# Patient Record
Sex: Male | Born: 1945
Health system: Southern US, Community
[De-identification: ages and names within clinical notes are randomized; demographics above are authoritative.]

## PROBLEM LIST (undated history)

## (undated) DIAGNOSIS — Z794 Long term (current) use of insulin: Secondary | ICD-10-CM

## (undated) DIAGNOSIS — F32A Depression, unspecified: Secondary | ICD-10-CM

## (undated) DIAGNOSIS — N400 Enlarged prostate without lower urinary tract symptoms: Secondary | ICD-10-CM

## (undated) DIAGNOSIS — Z950 Presence of cardiac pacemaker: Secondary | ICD-10-CM

## (undated) DIAGNOSIS — E119 Type 2 diabetes mellitus without complications: Secondary | ICD-10-CM

## (undated) DIAGNOSIS — E7800 Pure hypercholesterolemia, unspecified: Secondary | ICD-10-CM

## (undated) DIAGNOSIS — F329 Major depressive disorder, single episode, unspecified: Secondary | ICD-10-CM

## (undated) DIAGNOSIS — I482 Chronic atrial fibrillation, unspecified: Secondary | ICD-10-CM

## (undated) DIAGNOSIS — K219 Gastro-esophageal reflux disease without esophagitis: Secondary | ICD-10-CM

## (undated) DIAGNOSIS — I5022 Chronic systolic (congestive) heart failure: Secondary | ICD-10-CM

## (undated) DIAGNOSIS — I272 Pulmonary hypertension, unspecified: Secondary | ICD-10-CM

## (undated) DIAGNOSIS — I2510 Atherosclerotic heart disease of native coronary artery without angina pectoris: Secondary | ICD-10-CM

## (undated) DIAGNOSIS — K922 Gastrointestinal hemorrhage, unspecified: Secondary | ICD-10-CM

## (undated) DIAGNOSIS — I639 Cerebral infarction, unspecified: Secondary | ICD-10-CM

## (undated) DIAGNOSIS — I4891 Unspecified atrial fibrillation: Secondary | ICD-10-CM

## (undated) DIAGNOSIS — I340 Nonrheumatic mitral (valve) insufficiency: Secondary | ICD-10-CM

## (undated) DIAGNOSIS — R1310 Dysphagia, unspecified: Secondary | ICD-10-CM

## (undated) DIAGNOSIS — IMO0001 Reserved for inherently not codable concepts without codable children: Secondary | ICD-10-CM

## (undated) DIAGNOSIS — I428 Other cardiomyopathies: Secondary | ICD-10-CM

## (undated) HISTORY — DX: Presence of cardiac pacemaker: Z95.0

## (undated) HISTORY — DX: Nonrheumatic mitral (valve) insufficiency: I34.0

## (undated) HISTORY — DX: Chronic atrial fibrillation, unspecified: I48.20

## (undated) HISTORY — PX: COLONOSCOPY: SHX174

## (undated) HISTORY — DX: Gastrointestinal hemorrhage, unspecified: K92.2

## (undated) HISTORY — DX: Long term (current) use of insulin: Z79.4

## (undated) HISTORY — DX: Pure hypercholesterolemia, unspecified: E78.00

## (undated) HISTORY — DX: Pulmonary hypertension, unspecified: I27.20

## (undated) HISTORY — DX: Type 2 diabetes mellitus without complications: E11.9

## (undated) HISTORY — PX: HERNIA REPAIR: SHX51

## (undated) HISTORY — DX: Chronic systolic (congestive) heart failure: I50.22

## (undated) HISTORY — DX: Atherosclerotic heart disease of native coronary artery without angina pectoris: I25.10

## (undated) HISTORY — DX: Other cardiomyopathies: I42.8

## (undated) HISTORY — DX: Reserved for inherently not codable concepts without codable children: IMO0001

## (undated) HISTORY — PX: PACEMAKER PLACEMENT: SHX43

---

## 2000-06-21 DIAGNOSIS — I2510 Atherosclerotic heart disease of native coronary artery without angina pectoris: Secondary | ICD-10-CM

## 2000-06-21 HISTORY — DX: Atherosclerotic heart disease of native coronary artery without angina pectoris: I25.10

## 2000-09-09 ENCOUNTER — Inpatient Hospital Stay (HOSPITAL_COMMUNITY): Admission: EM | Admit: 2000-09-09 | Discharge: 2000-09-15 | Payer: Self-pay | Admitting: Emergency Medicine

## 2000-09-09 ENCOUNTER — Encounter: Payer: Self-pay | Admitting: *Deleted

## 2000-09-13 ENCOUNTER — Encounter: Payer: Self-pay | Admitting: Cardiology

## 2000-09-15 ENCOUNTER — Encounter: Payer: Self-pay | Admitting: Cardiology

## 2001-09-29 ENCOUNTER — Encounter: Payer: Self-pay | Admitting: Internal Medicine

## 2001-09-29 ENCOUNTER — Ambulatory Visit (HOSPITAL_COMMUNITY): Admission: RE | Admit: 2001-09-29 | Discharge: 2001-09-29 | Payer: Self-pay | Admitting: Internal Medicine

## 2003-08-27 ENCOUNTER — Emergency Department (HOSPITAL_COMMUNITY): Admission: EM | Admit: 2003-08-27 | Discharge: 2003-08-27 | Payer: Self-pay | Admitting: Emergency Medicine

## 2006-04-19 ENCOUNTER — Ambulatory Visit: Payer: Self-pay | Admitting: Cardiology

## 2006-04-19 ENCOUNTER — Encounter: Payer: Self-pay | Admitting: Physician Assistant

## 2006-05-02 ENCOUNTER — Ambulatory Visit: Payer: Self-pay | Admitting: Cardiology

## 2006-05-06 ENCOUNTER — Ambulatory Visit: Payer: Self-pay | Admitting: Cardiology

## 2006-05-06 ENCOUNTER — Encounter: Payer: Self-pay | Admitting: Cardiology

## 2009-09-22 ENCOUNTER — Emergency Department (HOSPITAL_COMMUNITY): Admission: EM | Admit: 2009-09-22 | Discharge: 2009-09-22 | Payer: Self-pay | Admitting: Emergency Medicine

## 2010-01-08 ENCOUNTER — Encounter: Payer: Self-pay | Admitting: Cardiology

## 2010-01-08 ENCOUNTER — Ambulatory Visit: Payer: Self-pay | Admitting: Cardiology

## 2010-01-09 ENCOUNTER — Encounter: Payer: Self-pay | Admitting: Cardiology

## 2010-01-13 ENCOUNTER — Encounter: Payer: Self-pay | Admitting: Physician Assistant

## 2010-01-13 ENCOUNTER — Ambulatory Visit: Payer: Self-pay | Admitting: Cardiology

## 2010-01-29 ENCOUNTER — Encounter: Payer: Self-pay | Admitting: Cardiology

## 2010-01-29 ENCOUNTER — Ambulatory Visit: Payer: Self-pay | Admitting: Physician Assistant

## 2010-01-29 DIAGNOSIS — I498 Other specified cardiac arrhythmias: Secondary | ICD-10-CM

## 2010-01-29 DIAGNOSIS — E119 Type 2 diabetes mellitus without complications: Secondary | ICD-10-CM

## 2010-01-29 DIAGNOSIS — I4891 Unspecified atrial fibrillation: Secondary | ICD-10-CM | POA: Insufficient documentation

## 2010-01-29 DIAGNOSIS — E785 Hyperlipidemia, unspecified: Secondary | ICD-10-CM | POA: Insufficient documentation

## 2010-01-29 DIAGNOSIS — I2510 Atherosclerotic heart disease of native coronary artery without angina pectoris: Secondary | ICD-10-CM | POA: Insufficient documentation

## 2010-01-30 ENCOUNTER — Encounter (INDEPENDENT_AMBULATORY_CARE_PROVIDER_SITE_OTHER): Payer: Self-pay | Admitting: *Deleted

## 2010-02-10 ENCOUNTER — Encounter: Payer: Self-pay | Admitting: Physician Assistant

## 2010-02-25 ENCOUNTER — Encounter: Payer: Self-pay | Admitting: Cardiology

## 2010-02-25 DIAGNOSIS — I10 Essential (primary) hypertension: Secondary | ICD-10-CM | POA: Insufficient documentation

## 2010-02-26 ENCOUNTER — Ambulatory Visit: Payer: Self-pay | Admitting: Cardiology

## 2010-04-07 ENCOUNTER — Ambulatory Visit: Payer: Self-pay | Admitting: Cardiology

## 2010-07-21 NOTE — Procedures (Signed)
Summary: PDS Event Monitor Final Summary  PDS Event Monitor Final Summary   Imported By: Cyril Loosen, RN, BSN 02/18/2010 10:13:50  _____________________________________________________________________  External Attachment:    Type:   Image     Comment:   External Document  Appended Document: PDS Event Monitor Final Summary Will plan to discuss results with pt at office visit on 02/26/10

## 2010-07-21 NOTE — Assessment & Plan Note (Signed)
Summary: EPH -D/C [facility] 7-22   Visit Type:  Follow-up Primary Provider:  Sherryll Burger   History of Present Illness: patient presents for posthospital followup.  Patient recently seen by [place] here at [facility] for atypical chest pain. He presented with history of nonobstructive CAD,  Hypertrophic cardiomyopathy, and paroxysmal atrial fibrillation, status post remote DC cardioversion. He is on chronic Coumadin, followed by the [facility] in [employer]. He was last seen here in the clinic in 2007, by Dr. Myrtis Ser.  Serial cardiac markers were nondiagnostic, with flat troponin in the 0.07 range. A 2-D echo was obtained, indicating EF 60-65% with moderate LVH (no SAM), and mild/moderate MR. Patient remained in atrial fibrillation with slow ventricular response, but no significant pauses, and not on any rate controlling agents.  He was referred for outpatient Lexis scan stress test, which was negative for ischemia EF 54%. He was also placed on a cardiac monitor, and presents today in followup.  Clinically, he denies any further chest pain. He denies tachycardia palpitations or exertional dyspnea. He has intermittent dizziness, but no frank syncope.  Extensive tracings have been obtained from his continuous monitor, and these indicate atrial fibrillation with rates as low as 35 bpm, but with no associated symptoms reported.   Preventive Screening-Counseling & Management  Alcohol-Tobacco     Smoking Status: quit     Year Quit: 1995  Current Medications (verified): 1)  Furosemide 80 Mg Tabs (Furosemide) .... Take 1 Tablet By Mouth Two Times A Day 2)  Alprazolam 0.5 Mg Tabs (Alprazolam) .... Take 1 Tablet By Mouth Once A Day 3)  Glipizide 5 Mg Tabs (Glipizide) .... Take 2 Tablet By Mouth Two Times A Day 4)  Lantus 100 Unit/ml Soln (Insulin Glargine) .... 40u Subcutaneously Daily 5)  Lisinopril 20 Mg Tabs (Lisinopril) .... Take 1/2 Tablet By Mouth Once A Day 6)  Lovastatin 40 Mg Tabs (Lovastatin) ....  Take 1 Tablet By Mouth Once A Day 7)  Metformin Hcl 500 Mg Tabs (Metformin Hcl) .... Take 2 Tablet By Mouth Two Times A Day 8)  Nitrostat 0.4 Mg Subl (Nitroglycerin) .... As Needed 9)  Potassium Chloride Crys Cr 20 Meq Cr-Tabs (Potassium Chloride Crys Cr) .... Take 1 Tablet By Mouth Once A Day 10)  Zoloft 100 Mg Tabs (Sertraline Hcl) .... Take 1 Tablet By Mouth Once A Day 11)  Terazosin Hcl 5 Mg Caps (Terazosin Hcl) .... Take 1 Tablet By Mouth Once A Day 12)  Trazodone Hcl 150 Mg Tabs (Trazodone Hcl) .... Take 1 Tablet By Mouth Once A Day 13)  Coumadin 5 Mg Tabs (Warfarin Sodium) .... Use As Directed  Allergies: 1)  ! Zocor 2)  ! Neurontin 3)  ! * Bee Sting  Comments:  Nurse/Medical Assistant: The patient is currently on medications but does not know the name or dosage at this time. Instructed to contact our office with details. Will update medication list at that time.  Past History:  Past Medical History: Last updated: 01/13/2010  1. History of hypertrophic cardiomyopathy.  This was shown by      catheterization in 2002.   2. Non-obstructive coronary disease by catheterization that was done in      2002.   3. Atrial fibrillation and flutter.   4. Diabetes.  5. Hypertension.  6. Hyperlipidemia.  7. Remote tobacco.  8. History of bradycardia.    9. Status post thyroid surgery.  10.History of lower gastrointestinal bleed with a negative colonoscopy.  11.Bilateral hernia repairs.  Social  History: Smoking Status:  quit  Review of Systems       No fevers, chills, hemoptysis, dysphagia, melena, hematocheezia, hematuria, rash, claudication, orthopnea, pnd, pedal edema. All other systems negative.   Vital Signs:  Patient profile:   65 year old male Height:      68 inches Weight:      224 pounds BMI:     34.18 Pulse rate:   50 / minute BP sitting:   123 / 79  (left arm) Cuff size:   large  Vitals Entered By: Carlye Grippe (January 29, 2010 1:19 PM)  Nutrition  Counseling: Patient's BMI is greater than 25 and therefore counseled on weight management options.   Physical Exam  Additional Exam:  GEN: 65 year old male, obese, no distress HEENT: NCAT,PERRLA,EOMI NECK: palpable pulses, no bruits; no JVD; no TM LUNGS: CTA bilaterally HEART: irregular irregular (S1S2); no significant murmurs; no rubs; no gallops ABD: soft, NT; intact BS EXT: intact distal pulses;trace edema SKIN: warm, dry MUSC: no obvious deformity NEURO: A/O (x3)     EKG  Procedure date:  01/29/2010  Findings:      atrial fibrillation at 45 bpm; LAD; nonspecific ST changes  Impression & Recommendations:  Problem # 1:  ATRIAL FIBRILLATION (ICD-427.31)  patient has documented marked bradycardia, but no significant associated symptoms. Continuous telemetry does not reveal significant pauses. Patient is not on any rate controlling medications. He is on chronic Coumadin, followed at the [facility] in [place]. On review with Dr. Diona Browner, plan is to continue watchful waiting, with no current indication for possible pacemaker implantation. Will check TSH level, to rule out hypothyroidism. We'll schedule return clinic visit with Dr. Myrtis Ser and myself in one month.  Problem # 2:  HOCM (ICD-425.1)  recent echo indicated normal LVF (EF 60-65%), with moderate LVH (no SAM), and mild/moderate MR.  Problem # 3:  CAD (ICD-414.00)  recent negative workup with nondiagnostic troponins, in setting of atypical chest pain. History of nonobstructive CAD by cardiac catheterization in 2002. Followup outpatient non-ischemic Lexiscan Cardiolite.  Problem # 4:  DM (ICD-250.00) Assessment: Comment Only  His updated medication list for this problem includes:    Glipizide 5 Mg Tabs (Glipizide) .Marland Kitchen... Take 2 tablet by mouth two times a day    Lantus 100 Unit/ml Soln (Insulin glargine) .Marland KitchenMarland KitchenMarland KitchenMarland Kitchen 40u subcutaneously daily    Lisinopril 20 Mg Tabs (Lisinopril) .Marland Kitchen... Take 1/2 tablet by mouth once a day     Metformin Hcl 500 Mg Tabs (Metformin hcl) .Marland Kitchen... Take 2 tablet by mouth two times a day  Problem # 5:  DYSLIPIDEMIA (ICD-272.4) Assessment: Comment Only  His updated medication list for this problem includes:    Lovastatin 40 Mg Tabs (Lovastatin) .Marland Kitchen... Take 1 tablet by mouth once a day  Other Orders: EKG w/ Interpretation (93000) T-TSH ([id number])  Patient Instructions: 1)  Follow up with Dr. Myrtis Ser on Thursday, February 26, 2010 at 2:30pm. 2)  You can return [employer]. 3)  Your physician recommends that you go to the [facility] for lab work.

## 2010-07-21 NOTE — Letter (Signed)
Summary: Engineer, materials at [facility]  [address]   [place], [place] [address]   Phone: [phone]  Fax: [phone]        January 30, 2010 MRN: [id number]   Bradley Daugherty [address] [place] [address] [place], [place]  [address]   Dear [name],  Your test ordered by Selena Batten has been reviewed by your physician (or physician assistant) and was found to be normal or stable. Your physician (or physician assistant) felt no changes were needed at this time.  ____ Echocardiogram  ____ Cardiac Stress Test  __X__ Lab Work  ____ Peripheral vascular study of arms, legs or neck  ____ CT scan or X-ray  ____ Lung or Breathing test  ____ Other:   Thank you.   Cyril Loosen, RN, BSN    Duane Boston, M.D., F.A.C.C. Thressa Sheller, M.D., F.A.C.C. Oneal Grout, M.D., F.A.C.C. Cheree Ditto, M.D., F.A.C.C. Daiva Nakayama, M.D., F.A.C.C. Kenney Houseman, M.D., F.A.C.C. Jeanne Ivan, PA-C

## 2010-07-21 NOTE — Miscellaneous (Signed)
  Clinical Lists Changes  Problems: Removed problem of HOCM (ICD-425.1) Added new problem of * ??  HOCM IN THE PAST  ?? Added new problem of * LVH Added new problem of HYPERTENSION (ICD-401.9) Observations: Added new observation of PAST MED HX: ??   hypertrophic cardiomyopathy ??  in the past  ??  not seen on echo 2011 EF 60-65%..... echo... July, 2011 LVH     moderate... echo... July, 2011  ( no SAM) Mitral regurgitation    mild to moderate.... echo... July, 2011 CAD     nonobstructive.... catheter.... 2002  /   nuclear... July, 2011.... no ischemia.... EF 55%  3. Atrial fibrillation and flutter. outpatient monitoring.... July, 2011.... atrial fibrillation.... rates as low as 35 but no symptoms.... plan to watch clinically Bradycardia Coumadin therapy  4. Diabetes.  5. Hypertension.  6. Hyperlipidemia.  7. Remote tobacco.  8. History of bradycardia.    9. Status post thyroid surgery.  10.History of lower gastrointestinal bleed with a negative colonoscopy.  11.Bilateral hernia repairs.  (02/25/2010 13:16) Added new observation of PRIMARY MD: Sherryll Burger (02/25/2010 13:16)       Past History:  Past Medical History: ??   hypertrophic cardiomyopathy ??  in the past  ??  not seen on echo 2011 EF 60-65%..... echo... July, 2011 LVH     moderate... echo... July, 2011  ( no SAM) Mitral regurgitation    mild to moderate.... echo... July, 2011 CAD     nonobstructive.... catheter.... 2002  /   nuclear... July, 2011.... no ischemia.... EF 55%  3. Atrial fibrillation and flutter. outpatient monitoring.... July, 2011.... atrial fibrillation.... rates as low as 35 but no symptoms.... plan to watch clinically Bradycardia Coumadin therapy  4. Diabetes.  5. Hypertension.  6. Hyperlipidemia.  7. Remote tobacco.  8. History of bradycardia.    9. Status post thyroid surgery.  10.History of lower gastrointestinal bleed with a negative colonoscopy.  11.Bilateral hernia repairs.

## 2010-07-21 NOTE — Consult Note (Signed)
Summary: CARDIOLOGY CONSULT/ [facility]  CARDIOLOGY CONSULT/ [facility]   Imported By: Zachary George 01/13/2010 09:02:54  _____________________________________________________________________  External Attachment:    Type:   Image     Comment:   External Document
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
## 2010-07-21 NOTE — Letter (Signed)
Summary: Discharge Summary/ [place]  Discharge Summary/ [place]   Imported By: Dorise Hiss 01/13/2010 09:12:21  _____________________________________________________________________  External Attachment:    Type:   Image     Comment:   External Document

## 2010-07-21 NOTE — Cardiovascular Report (Signed)
Summary: Cardiac Catheterization  Cardiac Catheterization   Imported By: Dorise Hiss 01/13/2010 09:14:07  _____________________________________________________________________  External Attachment:    Type:   Image     Comment:   External Document

## 2010-07-24 NOTE — Letter (Signed)
Summary: [facility] D[facility]C [facility]  [facility]C [facility]   Imported By: Zachary George 01/29/2010 11:45:01  _____________________________________________________________________  External Attachment:    Type:   Image     Comment:   External Document

## 2010-07-24 NOTE — Consult Note (Signed)
Summary: CARDIOLOGY CONSULT/[facility]  CARDIOLOGY CONSULT/[facility]   Imported By: Zachary George 01/13/2010 09:25:25  _____________________________________________________________________  External Attachment:    Type:   Image     Comment:   External Document

## 2010-09-09 LAB — POCT I-STAT, CHEM 8
Creatinine, Ser: 0.7 mg/dL (ref 0.4–1.5)
Hemoglobin: 13.3 g/dL (ref 13.0–17.0)

## 2010-09-09 LAB — GLUCOSE, CAPILLARY: Glucose-Capillary: 139 mg/dL — ABNORMAL HIGH (ref 70–99)

## 2010-11-06 NOTE — Assessment & Plan Note (Signed)
[facility]                            [facility] NOTE   [name], [name]                       MRN:          [id number]  DATE:05/02/2006                            DOB:          Apr 05, 1946    [name] is seen post  hospitalization.  He was recently at [facility].  He  had some chest discomfort and he was in atrial fibrillation and he was  stable.  We recommended that he could go home for outpatient follow up.  He  now returns.  We know that he does have coronary disease.  It is mild.  He  also has hypertrophic cardiomyopathy.  The patient has had atrial  fibrillation in the past.  I cannot document how long he has been in atrial  fibrillation.  Also I cannot document what the thought process was in the  office in [place] in the past, concerning how to approach his atrial  fibrillation, if it were to return.  I am considering cardioversion but with  his hypertrophic cardiomyopathy and other issues, I want to be absolutely  sure that we have fully researched the approach before proceeding.  He is  tolerating his atrial fibrillation at this time.   PAST MEDICAL HISTORY:   ALLERGIES:  He gets joint aches from ZOCOR.   MEDICATIONS:  1. KCL.  2. Warfarin followed at the [place].  3. Terazosin 2 mg at night.  4. Metformin.  5. Glipizide.  6. Lisinopril 10 mg daily.  7. Lovastatin 40.  8. Furosemide 80 b.i.d.   OTHER MEDICAL PROBLEMS:  See the list below.   REVIEW OF SYSTEMS:  As of today he is feeling relatively well.  He is not  having any significant GI or GU symptoms.  His review of systems otherwise  is negative.   PHYSICAL EXAMINATION:  The patient weighs 231 pounds.  Pulse rate is  approximately 60.  Blood pressure is 128/74.  Patient is oriented to person, time and place and his affect is normal.  HEENT:  Reveals no xanthelasma.  He has normal extraocular motion.  There  are no carotid bruits.  There is no jugular venous  distention.  LUNGS:  Are clear.  Respiratory effort is not labored.  CARDIAC:  Reveals an S1 with an S2. The rhythm is irregularly irregular.  There are no significant murmurs heard.  ABDOMEN:  Is obese but soft.  There are no masses or bruits.  The patient has no significant peripheral edema.   EKG reveals atrial fibrillation with a controlled ventricular response.   PROBLEMS:  Include:  1. History of hypertrophic cardiomyopathy.  This was shown by      catheterization in 2002.  We will try to obtain the actual old chart      from [facility], so that I can review all of what has been done in the      past concerning his hypertrophic myopathy and his atrial fibrillation.  2. Non-obstructive coronary disease by catheterization that was done in      2002.  It is also my understanding that he  had a catheterization      somewhere in [facility] in the last year or two.  I do      not have those results.  We know that in 2002 he did have a mid LAD      lesion as high as 70%.  3. Atrial fibrillation and flutter.  The patient is on chronic Coumadin.      His last INR in the hospital was in the 1.5 to 1.8 range.  If we decide      to cardiovert him we need more of a stable INR.  4. Diabetes.  5. Hypertension.  6. Hyperlipidemia.  7. Remote tobacco.  8. History of bradycardia.  His Lopressor had been titrated down over time      because he tends to have some bradycardia.  9. Status post thyroid surgery.  10.History of lower gastrointestinal bleed with a negative colonoscopy.  11.Bilateral hernia repairs.   I will try to get the [facility] records.  We will not change his medicines  today.  We will contact the [facility] to find out if and when  we decide to cardiovert him we can find a way to check his prothrombin time  weekly for 3 or 4 weeks in a row.  Also he needs a 2-D echo for  reassessment.  I will check to see if he has had one lately; if not, we will   arrange it.  Then I will see him in followup.     Bradley Abed, MD, [facility]  Electronically Signed    JDK/MedQ  DD: 05/02/2006  DT: 05/02/2006  Job #: [id number]   cc:   [facility] [place], [place]

## 2010-11-06 NOTE — Cardiovascular Report (Signed)
[place]. [facility]  Patient:    SAQUAN, FURTICK                       MRN: [id number] Proc. Date: 09/13/00 Adm. Date:  [id number] Attending:  Daisey Must CC:         Daisey Must, M.D. [facility]  M. Linna Darner, M.D.  Cardiac Catheterization Lab   Cardiac Catheterization  CLINICAL HISTORY:  Mr. Hettich is 65 years old and has known hypertrophic cardiomyopathy.  At his last echocardiogram, he had no evidence of resting subaortic valve gradient.  He was admitted recently with chest pain and atrial flutter.  His enzymes were negative.  He was scheduled for a cardiac catheterization.  He had been on Coumadin prior to admission, and the Coumadin was held.  DESCRIPTION OF PROCEDURE:  The procedure was performed via the right femoral artery and right femoral vein using a venous sheath and a Swan-Ganz thermodilution catheter.  A front wall arterial puncture was performed, and 6-French coronary catheters were used.  We used a Multipurpose catheter so we could get a better pressure in the left ventricle for a subvalvular gradient. We did not simultaneously measure a femoral artery pressure with the LV pressure.  The patient tolerated the procedure well and left the laboratory in satisfactory condition.  RESULTS: 1. The left main coronary artery:  The left main coronary artery was free of    significant disease. 2. The left anterior descending artery:  The left anterior descending    artery gave rise to 2 diagonal branches and a septal perforator.  There    was 30% proximal stenosis.  The distal LAD was somewhat diffusely diseased,    and there was 70% segmental narrowing. 3. The circumflex artery:  The circumflex artery gave rise to an intermedius    branch, a marginal branch, and 2 posterolateral branches.  There was 30%    proximal stenosis. 4. The right coronary artery:  The right coronary artery gave rise to 2 right    ventricular branches, a posterior  descending branch, and 3 posterolateral    branches.  There was 40% distal stenosis in the right coronary artery.    There was 30% narrowing before the last 2 posterolateral branches.  Left ventriculogram:  The left ventriculogram, performed in the RAO projection, showed marked LVH with marked hypertrophy of the papillary muscles.  There was only minimal mitral regurgitation.  The estimated ejection fraction was 70%.  HEMODYNAMIC DATA:  The right atrial pressure was 13 mean.  The pulmonary artery pressure was 53/18 with a mean of 59.  The pulmonary wedge pressure was 28 mean.  Left ventricular pressure was 161/28.  Aortic pressure was 161/106.  There was little or no gradient across the aortic valve at time of pullback although the patient was in flutter, and we did not do simultaneous LV and FA pressures.  CONCLUSION: 1. Probably nonobstructive coronary artery disease with 30% proximal and 70%    mid stenosis in the left anterior descending artery, 30% narrowing in the    proximal circumflex artery, 40% distal stenosis in the right coronary    artery with 30% stenosis of the posterolateral branch. 2. Hypertrophic cardiomyopathy with marked LVH and marked elevation of the    pulmonary wedge pressure of 28-30.  RECOMMENDATIONS:  The patient has mostly nonobstructive coronary disease and I think this is probably not responsible for his symptoms.  He does have marked elevation of his filling  pressures related to his cardiomyopathy and the atrial flutter.  I discussed this with Dr. Gerri Spore and Dr. Graciela Husbands, and we think the best option is to do a TE-guided cardioversion tomorrow with plans for rescheduling a flutter ablation at some time in the future. DD:  09/13/00 TD:  09/14/00 Job: [id number]

## 2010-11-06 NOTE — Discharge Summary (Signed)
[place]. [facility]  Patient:    TAVI, HOOGENDOORN                       MRN: [id number] Adm. Date:  [id number] Disc. Date: 09/15/00 Attending:  Daisey Must Dictator:   Joellyn Rued, P.A.C. CC:         M. Linna Darner, M.D.  Daisey Must, M.D. [facility]   Discharge Summary  DATE OF BIRTH:  May 28, 1946  SUMMARY OF HISTORY:  Mr. Sitar is a 65 year old white male with a history of atrial fibrillation/flutter who presented to the emergency room complaining of chest cramping.  Initially the discomfort started on the evening prior to his admission around 5:30 p.m.  These had intermittent cramping in the mid sternal area lasting 5-15 seconds.  Patient drove to the emergency room, but left because the discomfort had stopped.  The discomfort returned on the day of admission while driving.  He again experienced this cramping with radiation to the right jaw.  He did go to work and continued to have intermittent episodes. After he became nauseated he went to the emergency room.  He also noticed some shortness of breath in these episodes.  He has also noticed increased dyspnea on exertion for the last month.  He was recently placed on Coumadin approximately two to three weeks ago by Dr. Linna Darner and he states he has not had a PT/INR checked since it was started.  He has a history of hypertrophic cardiomyopathy.  His last echocardiogram on August 19, 2000 showed asymmetrical hypotrophy without resting outflow gradient, mild MR, restrictive filling patterns consistent with increased left diastolic pressure, mild TR.  He has a history of hyperlipidemia and hematochezia but his recent colonoscopy was negative.  History of peptic ulcer disease via EGD six or seven years ago. History of Mallory-Weiss tears after excessive emesis six or seven years ago. Parathyroidectomy.  Benign bilateral inguinal hernia repair.  Stress echocardiogram where he achieved an 87% predicted maximum heart  rate after exercising for four minutes.  He did not have any chest discomfort or arrhythmias and stopped due to fatigue.  He did not have any ischemia. Echocardiogram showed inferolateral ST segment changes and inversion resting. History of remote tobacco use.  LABORATORIES:  Urinalysis is unremarkable.  On admission his initial CK was 183 with an MB of 5.3 and relative index 2.9.  Troponin was 0.08.  Sequence CKs were essentially the same.  Troponins were 0.07.  Sodium 139, potassium 4.0, BUN 16, creatinine 0.9, glucose 131.  Chemistries essentially remained unchanged.  LFTs were within normal limits.  His admission PT was 28.9, INR 4.2.  H&H 13.2 and 39.0.  Normal indices.  Platelets 231,000, WBC 5.3.  Stools were heme negative.  His INR on February 23 was 4.9, on March 24 3.1, on March 25 2.3, on March 26 1.7, on March 27 1.5, on March 28 1.7.  HOSPITAL COURSE:  Mr. Berendt was admitted to 3700.  It was felt that his chest discomfort may be related to ischemia despite enzymes and EKGs being negative. His Coumadin was held in anticipation of cardiac catheterization and heparin will be started once his INR was less than 2.0.  Atrial flutter was also noted on his EKG.  Overnight he did not have any further chest discomfort or shortness of breath.  It was felt that he did not have a myocardial infarction despite slightly elevated troponins.  Right and left cardiac catheterization was performed on  March 26 since his INR was down to 1.7.  According to Dr. Wanita Chamberlain note he had a distal 40% RCA, 30% PLA, 30% proximal LAD, 30% proximal circumflex, 70% mid LAD.  Ejection fraction was 70% without wall motion abnormalities.  He did have marked LVH with an RA pressure of 13, PA 53/18, mean 59, PAW 28, LV 161/28, aorta 161/106.  It was felt that he had primarily nonobstructive coronary artery disease with a markedly elevated PAW. It was felt that he should continue planned medical treatment,  consider flutter ablation, add a beta blocker.  Dr. Graciela Husbands reviewed for consideration of atrial flutter ablation but this could not be scheduled until next week. Thus, TEE guided cardioversion was arranged.  Coumadin was restarted on the evening of the 26th at 5 mg q.d.  TEE cardioversion was performed on March 28 by Dr. Eden Emms utilizing 100, then 200 joules.  He was shocked to normal sinus rhythm.  On March 28 his INR was 1.7 and it was felt that he could be discharged home on subcutaneous Lovenox until his INR was greater than 2.0.  DISCHARGE DIAGNOSES: 1. Chest discomfort of undetermined etiology. 2. Atrial flutter status post DC cardioversion as previously described. 3. Elevated INR on admission. 4. History of hypertrophic cardiomyopathy. 5. Hyperlipidemia.  DISPOSITION:  He is discharged home.  DISCHARGE MEDICATIONS: 1. Lipitor 10 mg q.h.s. 2. Coumadin decreased to 5 mg q.d. except on Sunday and Friday he will take a    half tablet. 3. New prescription for Lasix 40 mg q.d. 4. Aspirin 81 mg q.d. 5. Lopressor 50 mg b.i.d. 6. Lovenox 1 mg q.12h. until his INR is greater than 2.0.  This was discussed with Corrie Dandy of our Coumadin clinic and he will have a PT/INR drawn on Saturday with the results paged to her at home.  She will then call the patient at home and let him know if he needs to adjust his Coumadin and to continue with his Lovenox shots.  He will also have an INR checked on Tuesday at 3:45 p.m. at the Coumadin clinic.  He will see Dr. Ladona Ridgel in three weeks on April 19 at 3:45.  In the meantime he was instructed no heavy lifting, sexual activity, driving for two days and given permission to return to work Monday. He was told not to take his Cardizem.  Maintain low salt, fat, cholesterol diet.  Avoid pseudoephedrine.  If he had any problems with his catheterization site he is asked to call immediately. DD:  09/15/00 TD:  09/15/00 Job: [id number]

## 2011-03-19 DIAGNOSIS — I498 Other specified cardiac arrhythmias: Secondary | ICD-10-CM

## 2012-02-13 DIAGNOSIS — I509 Heart failure, unspecified: Secondary | ICD-10-CM

## 2012-02-14 DIAGNOSIS — I059 Rheumatic mitral valve disease, unspecified: Secondary | ICD-10-CM

## 2012-02-14 DIAGNOSIS — I509 Heart failure, unspecified: Secondary | ICD-10-CM

## 2012-02-15 DIAGNOSIS — I509 Heart failure, unspecified: Secondary | ICD-10-CM

## 2012-02-15 DIAGNOSIS — I5023 Acute on chronic systolic (congestive) heart failure: Secondary | ICD-10-CM

## 2012-02-17 DIAGNOSIS — R0602 Shortness of breath: Secondary | ICD-10-CM

## 2012-02-25 ENCOUNTER — Ambulatory Visit (INDEPENDENT_AMBULATORY_CARE_PROVIDER_SITE_OTHER): Payer: Non-veteran care | Admitting: Internal Medicine

## 2012-02-25 ENCOUNTER — Encounter: Payer: Self-pay | Admitting: Cardiology

## 2012-02-25 ENCOUNTER — Encounter: Payer: Self-pay | Admitting: Internal Medicine

## 2012-02-25 VITALS — BP 130/80 | HR 60 | Ht 68.0 in | Wt 200.8 lb

## 2012-02-25 DIAGNOSIS — Z950 Presence of cardiac pacemaker: Secondary | ICD-10-CM

## 2012-02-25 DIAGNOSIS — I5022 Chronic systolic (congestive) heart failure: Secondary | ICD-10-CM

## 2012-02-25 DIAGNOSIS — I4891 Unspecified atrial fibrillation: Secondary | ICD-10-CM | POA: Diagnosis not present

## 2012-02-25 DIAGNOSIS — I428 Other cardiomyopathies: Secondary | ICD-10-CM | POA: Diagnosis not present

## 2012-02-25 NOTE — Assessment & Plan Note (Signed)
Permanent. On warfarin 

## 2012-02-25 NOTE — Assessment & Plan Note (Signed)
,  dfy The patient's device was interrogated and the information was fully reviewed.  The device was reprogrammed from the DDDR mode to VVI mode

## 2012-02-25 NOTE — Assessment & Plan Note (Signed)
The patient has a newly identified cardiomyopathy, presumably nonischemic based on prior catheterization. It could be related to progressive LV dysfunction, pacemaker-induced cardiomyopathy or intercurrent development of ischemia.  He would like it followed by the [place]. He needs to be placed on carvedilol. He would like this to be started at the [place]. He will probably benefit from Aldactone. He would then need reassessment of left ventricular function to determine whether he would be an appropriate candidate for revision of his pacemaker system to either CRT or ICD

## 2012-02-25 NOTE — Assessment & Plan Note (Signed)
Relatively euvolemic today. We'll continue him on his current medications. He is to be seen at the [place] on Monday. I would at carvedilol as noted above and then consider the addition of Aldactone. His renal function 2 weeks ago was normal with a creatinine of 0.8

## 2012-02-25 NOTE — Progress Notes (Signed)
[facility] Patient Care Team: Kirstie Peri, MD as PCP - General (Internal Medicine)   HPI  Bradley Daugherty is a 66 y.o. male Seen in followup with a history of atrial fibrillation nonobstructive coronary disease diabetes  Recently hospitalized for acute and chronic congestive heart failure. He was found to have an ejection fraction previously at 45-50% now at 30-35%. He was diuresed and discharged.  He has had, intercurrently, implantation of a [employer]tronic Adapta dual-chamber pacemaker. His atrial fibrillation is permanent. He is ventricularly pacing about 100% of the time.  He is feeling better since discharge. There is less edema. He has mild dyspnea on exertion. He has had no chest pain.  Past Medical History  Diagnosis Date  . CAD (coronary artery disease) 2002    Nonobstructive  . Chronic atrial fibrillation     Chronic anticoagulation therapy, followed at the [facility] clinic in [place], [place]  . Hypercholesterolemia   . Pulmonary hypertension   . Moderate mitral regurgitation   . Diabetes mellitus, insulin dependent (IDDM), controlled   . Bradycardia     Resulting in pacemaker placement  . Lower GI bleed     negative colonoscopy  . Atrial dysrhythmia     Past Surgical History  Procedure Date  . Pacemaker placement   . Hernia repair   . Colonoscopy     Current Outpatient Prescriptions  Medication Sig Dispense Refill  . ALPRAZolam (XANAX) 0.5 MG tablet Take 0.5 mg by mouth as needed.      . furosemide (LASIX) 80 MG tablet Take 80 mg by mouth 2 (two) times daily.      Marland Kitchen glipiZIDE (GLUCOTROL) 10 MG tablet Take 10 mg by mouth 2 (two) times daily before a meal.      . insulin glargine (LANTUS) 100 UNIT/ML injection Inject 50 Units into the skin at bedtime.       Marland Kitchen lisinopril (PRINIVIL,ZESTRIL) 20 MG tablet Take 20 mg by mouth daily.      Marland Kitchen lovastatin (MEVACOR) 40 MG tablet Take 40 mg by mouth at bedtime.      . metFORMIN (GLUCOPHAGE) 1000 MG tablet Take 1,000 mg by mouth 2 (two) times  daily with a meal.      . potassium chloride SA (K-DUR,KLOR-CON) 20 MEQ tablet Take 20 mEq by mouth daily.      . sertraline (ZOLOFT) 100 MG tablet Take 100 mg by mouth daily.      . Tamsulosin HCl (FLOMAX) 0.4 MG CAPS Take 0.4 mg by mouth daily after supper.      . traZODone (DESYREL) 150 MG tablet Take 150 mg by mouth at bedtime.      Marland Kitchen warfarin (COUMADIN) 5 MG tablet Take 5 mg by mouth as directed.        Allergies  Allergen Reactions  . Gabapentin     REACTION: myalgia  . Simvastatin     REACTION: myalgia    Review of Systems negative except from HPI and PMH  Physical Exam BP 130/80  Pulse 60  Ht 5\' 8"  (1.727 m)  Wt 200 lb 12.8 oz (91.082 kg)  BMI 30.53 kg/m2 Well developed and well nourished in no acute distress HENT normal E scleral and icterus clear Neck Supple JVP<10 carotids brisk and full Clear to ausculation Device pocket well healed; without hematoma or erythema   Regular rate and rhythm, no murmurs gallops or rub Soft with active bowel sounds No clubbing cyanosis Trace electrocardiogram demonstrates ventricular pacing with underlying atrial fibrillation Edema Alert and  oriented, grossly normal motor and sensory function Skin Warm and Dry    Assessment and  Plan

## 2012-02-25 NOTE — Patient Instructions (Addendum)

## 2012-03-03 ENCOUNTER — Encounter: Payer: PRIVATE HEALTH INSURANCE | Admitting: Cardiology

## 2014-02-26 DIAGNOSIS — I509 Heart failure, unspecified: Secondary | ICD-10-CM | POA: Diagnosis not present

## 2014-02-26 DIAGNOSIS — IMO0001 Reserved for inherently not codable concepts without codable children: Secondary | ICD-10-CM | POA: Diagnosis not present

## 2014-03-11 DIAGNOSIS — R040 Epistaxis: Secondary | ICD-10-CM | POA: Diagnosis not present

## 2014-04-04 DIAGNOSIS — Z23 Encounter for immunization: Secondary | ICD-10-CM | POA: Diagnosis not present

## 2014-06-20 DIAGNOSIS — E1165 Type 2 diabetes mellitus with hyperglycemia: Secondary | ICD-10-CM | POA: Diagnosis not present

## 2014-06-20 DIAGNOSIS — I10 Essential (primary) hypertension: Secondary | ICD-10-CM | POA: Diagnosis not present

## 2014-10-02 DIAGNOSIS — E1165 Type 2 diabetes mellitus with hyperglycemia: Secondary | ICD-10-CM | POA: Diagnosis not present

## 2014-10-02 DIAGNOSIS — Z125 Encounter for screening for malignant neoplasm of prostate: Secondary | ICD-10-CM | POA: Diagnosis not present

## 2014-10-02 DIAGNOSIS — I10 Essential (primary) hypertension: Secondary | ICD-10-CM | POA: Diagnosis not present

## 2014-10-02 DIAGNOSIS — Z7901 Long term (current) use of anticoagulants: Secondary | ICD-10-CM | POA: Diagnosis not present

## 2014-10-02 DIAGNOSIS — Z0000 Encounter for general adult medical examination without abnormal findings: Secondary | ICD-10-CM | POA: Diagnosis not present

## 2014-11-13 DIAGNOSIS — L03114 Cellulitis of left upper limb: Secondary | ICD-10-CM | POA: Diagnosis not present

## 2014-11-21 DIAGNOSIS — L03119 Cellulitis of unspecified part of limb: Secondary | ICD-10-CM | POA: Diagnosis not present

## 2015-01-09 DIAGNOSIS — L309 Dermatitis, unspecified: Secondary | ICD-10-CM | POA: Diagnosis not present

## 2015-01-09 DIAGNOSIS — E1165 Type 2 diabetes mellitus with hyperglycemia: Secondary | ICD-10-CM | POA: Diagnosis not present

## 2015-01-09 DIAGNOSIS — I10 Essential (primary) hypertension: Secondary | ICD-10-CM | POA: Diagnosis not present

## 2015-01-09 DIAGNOSIS — I482 Chronic atrial fibrillation: Secondary | ICD-10-CM | POA: Diagnosis not present

## 2015-04-07 DIAGNOSIS — Z23 Encounter for immunization: Secondary | ICD-10-CM | POA: Diagnosis not present

## 2015-06-03 DIAGNOSIS — E1165 Type 2 diabetes mellitus with hyperglycemia: Secondary | ICD-10-CM | POA: Diagnosis not present

## 2015-06-03 DIAGNOSIS — Z125 Encounter for screening for malignant neoplasm of prostate: Secondary | ICD-10-CM | POA: Diagnosis not present

## 2015-06-03 DIAGNOSIS — I10 Essential (primary) hypertension: Secondary | ICD-10-CM | POA: Diagnosis not present

## 2015-06-03 DIAGNOSIS — N4289 Other specified disorders of prostate: Secondary | ICD-10-CM | POA: Diagnosis not present

## 2015-06-03 DIAGNOSIS — L308 Other specified dermatitis: Secondary | ICD-10-CM | POA: Diagnosis not present

## 2015-06-03 DIAGNOSIS — I482 Chronic atrial fibrillation: Secondary | ICD-10-CM | POA: Diagnosis not present

## 2015-09-08 DIAGNOSIS — R3981 Functional urinary incontinence: Secondary | ICD-10-CM | POA: Diagnosis not present

## 2015-09-08 DIAGNOSIS — R3915 Urgency of urination: Secondary | ICD-10-CM | POA: Diagnosis not present

## 2015-09-08 DIAGNOSIS — R972 Elevated prostate specific antigen [PSA]: Secondary | ICD-10-CM | POA: Diagnosis not present

## 2015-09-09 DIAGNOSIS — R3915 Urgency of urination: Secondary | ICD-10-CM | POA: Diagnosis not present

## 2015-09-09 DIAGNOSIS — R3981 Functional urinary incontinence: Secondary | ICD-10-CM | POA: Diagnosis not present

## 2015-10-21 DIAGNOSIS — R972 Elevated prostate specific antigen [PSA]: Secondary | ICD-10-CM | POA: Diagnosis not present

## 2015-10-30 DIAGNOSIS — R3981 Functional urinary incontinence: Secondary | ICD-10-CM | POA: Diagnosis not present

## 2015-10-30 DIAGNOSIS — R972 Elevated prostate specific antigen [PSA]: Secondary | ICD-10-CM | POA: Diagnosis not present

## 2015-10-30 DIAGNOSIS — R3915 Urgency of urination: Secondary | ICD-10-CM | POA: Diagnosis not present

## 2015-12-05 DIAGNOSIS — I10 Essential (primary) hypertension: Secondary | ICD-10-CM | POA: Diagnosis not present

## 2015-12-05 DIAGNOSIS — I482 Chronic atrial fibrillation: Secondary | ICD-10-CM | POA: Diagnosis not present

## 2015-12-05 DIAGNOSIS — N4289 Other specified disorders of prostate: Secondary | ICD-10-CM | POA: Diagnosis not present

## 2015-12-05 DIAGNOSIS — E1165 Type 2 diabetes mellitus with hyperglycemia: Secondary | ICD-10-CM | POA: Diagnosis not present

## 2016-01-30 DIAGNOSIS — F0280 Dementia in other diseases classified elsewhere without behavioral disturbance: Secondary | ICD-10-CM | POA: Diagnosis not present

## 2016-01-30 DIAGNOSIS — E1165 Type 2 diabetes mellitus with hyperglycemia: Secondary | ICD-10-CM | POA: Diagnosis not present

## 2016-01-30 DIAGNOSIS — I482 Chronic atrial fibrillation: Secondary | ICD-10-CM | POA: Diagnosis not present

## 2016-01-30 DIAGNOSIS — I10 Essential (primary) hypertension: Secondary | ICD-10-CM | POA: Diagnosis not present

## 2016-02-23 DIAGNOSIS — I110 Hypertensive heart disease with heart failure: Secondary | ICD-10-CM | POA: Diagnosis not present

## 2016-02-23 DIAGNOSIS — I509 Heart failure, unspecified: Secondary | ICD-10-CM | POA: Diagnosis not present

## 2016-02-23 DIAGNOSIS — J309 Allergic rhinitis, unspecified: Secondary | ICD-10-CM | POA: Diagnosis not present

## 2016-02-23 DIAGNOSIS — R079 Chest pain, unspecified: Secondary | ICD-10-CM | POA: Diagnosis not present

## 2016-02-23 DIAGNOSIS — I5041 Acute combined systolic (congestive) and diastolic (congestive) heart failure: Secondary | ICD-10-CM | POA: Diagnosis not present

## 2016-02-23 DIAGNOSIS — K8020 Calculus of gallbladder without cholecystitis without obstruction: Secondary | ICD-10-CM | POA: Diagnosis not present

## 2016-02-23 DIAGNOSIS — F0280 Dementia in other diseases classified elsewhere without behavioral disturbance: Secondary | ICD-10-CM | POA: Diagnosis not present

## 2016-02-23 DIAGNOSIS — I209 Angina pectoris, unspecified: Secondary | ICD-10-CM | POA: Diagnosis not present

## 2016-02-23 DIAGNOSIS — F0390 Unspecified dementia without behavioral disturbance: Secondary | ICD-10-CM | POA: Diagnosis not present

## 2016-02-23 DIAGNOSIS — J90 Pleural effusion, not elsewhere classified: Secondary | ICD-10-CM | POA: Diagnosis not present

## 2016-02-23 DIAGNOSIS — R0789 Other chest pain: Secondary | ICD-10-CM | POA: Diagnosis not present

## 2016-02-24 DIAGNOSIS — F0280 Dementia in other diseases classified elsewhere without behavioral disturbance: Secondary | ICD-10-CM | POA: Diagnosis not present

## 2016-02-24 DIAGNOSIS — Z888 Allergy status to other drugs, medicaments and biological substances status: Secondary | ICD-10-CM | POA: Diagnosis not present

## 2016-02-24 DIAGNOSIS — I110 Hypertensive heart disease with heart failure: Secondary | ICD-10-CM | POA: Diagnosis not present

## 2016-02-24 DIAGNOSIS — Z79899 Other long term (current) drug therapy: Secondary | ICD-10-CM | POA: Diagnosis not present

## 2016-02-24 DIAGNOSIS — R079 Chest pain, unspecified: Secondary | ICD-10-CM | POA: Diagnosis present

## 2016-02-24 DIAGNOSIS — N400 Enlarged prostate without lower urinary tract symptoms: Secondary | ICD-10-CM | POA: Diagnosis present

## 2016-02-24 DIAGNOSIS — E785 Hyperlipidemia, unspecified: Secondary | ICD-10-CM | POA: Diagnosis present

## 2016-02-24 DIAGNOSIS — K219 Gastro-esophageal reflux disease without esophagitis: Secondary | ICD-10-CM | POA: Diagnosis present

## 2016-02-24 DIAGNOSIS — Z794 Long term (current) use of insulin: Secondary | ICD-10-CM | POA: Diagnosis not present

## 2016-02-24 DIAGNOSIS — K8010 Calculus of gallbladder with chronic cholecystitis without obstruction: Secondary | ICD-10-CM | POA: Diagnosis not present

## 2016-02-24 DIAGNOSIS — K8020 Calculus of gallbladder without cholecystitis without obstruction: Secondary | ICD-10-CM | POA: Diagnosis present

## 2016-02-24 DIAGNOSIS — F0390 Unspecified dementia without behavioral disturbance: Secondary | ICD-10-CM | POA: Diagnosis present

## 2016-02-24 DIAGNOSIS — Z950 Presence of cardiac pacemaker: Secondary | ICD-10-CM | POA: Diagnosis not present

## 2016-02-24 DIAGNOSIS — J309 Allergic rhinitis, unspecified: Secondary | ICD-10-CM | POA: Diagnosis present

## 2016-02-24 DIAGNOSIS — Z7901 Long term (current) use of anticoagulants: Secondary | ICD-10-CM | POA: Diagnosis not present

## 2016-02-24 DIAGNOSIS — F329 Major depressive disorder, single episode, unspecified: Secondary | ICD-10-CM | POA: Diagnosis present

## 2016-02-24 DIAGNOSIS — E039 Hypothyroidism, unspecified: Secondary | ICD-10-CM | POA: Diagnosis present

## 2016-02-24 DIAGNOSIS — E119 Type 2 diabetes mellitus without complications: Secondary | ICD-10-CM | POA: Diagnosis present

## 2016-02-24 DIAGNOSIS — Z87891 Personal history of nicotine dependence: Secondary | ICD-10-CM | POA: Diagnosis not present

## 2016-02-24 DIAGNOSIS — R0789 Other chest pain: Secondary | ICD-10-CM | POA: Diagnosis not present

## 2016-02-24 DIAGNOSIS — I482 Chronic atrial fibrillation: Secondary | ICD-10-CM | POA: Diagnosis present

## 2016-02-24 DIAGNOSIS — Z8249 Family history of ischemic heart disease and other diseases of the circulatory system: Secondary | ICD-10-CM | POA: Diagnosis not present

## 2016-02-24 DIAGNOSIS — I5041 Acute combined systolic (congestive) and diastolic (congestive) heart failure: Secondary | ICD-10-CM | POA: Diagnosis not present

## 2016-02-24 DIAGNOSIS — Z91030 Bee allergy status: Secondary | ICD-10-CM | POA: Diagnosis not present

## 2016-03-09 DIAGNOSIS — E038 Other specified hypothyroidism: Secondary | ICD-10-CM | POA: Diagnosis not present

## 2016-03-09 DIAGNOSIS — I482 Chronic atrial fibrillation: Secondary | ICD-10-CM | POA: Diagnosis not present

## 2016-03-09 DIAGNOSIS — I10 Essential (primary) hypertension: Secondary | ICD-10-CM | POA: Diagnosis not present

## 2016-03-09 DIAGNOSIS — F338 Other recurrent depressive disorders: Secondary | ICD-10-CM | POA: Diagnosis not present

## 2016-03-09 DIAGNOSIS — N4289 Other specified disorders of prostate: Secondary | ICD-10-CM | POA: Diagnosis not present

## 2016-03-09 DIAGNOSIS — K219 Gastro-esophageal reflux disease without esophagitis: Secondary | ICD-10-CM | POA: Diagnosis not present

## 2016-03-09 DIAGNOSIS — E1165 Type 2 diabetes mellitus with hyperglycemia: Secondary | ICD-10-CM | POA: Diagnosis not present

## 2016-03-09 DIAGNOSIS — F0280 Dementia in other diseases classified elsewhere without behavioral disturbance: Secondary | ICD-10-CM | POA: Diagnosis not present

## 2016-04-27 DIAGNOSIS — R972 Elevated prostate specific antigen [PSA]: Secondary | ICD-10-CM | POA: Diagnosis not present

## 2016-04-28 DIAGNOSIS — F0280 Dementia in other diseases classified elsewhere without behavioral disturbance: Secondary | ICD-10-CM | POA: Diagnosis not present

## 2016-04-28 DIAGNOSIS — R0789 Other chest pain: Secondary | ICD-10-CM | POA: Diagnosis not present

## 2016-04-28 DIAGNOSIS — N400 Enlarged prostate without lower urinary tract symptoms: Secondary | ICD-10-CM | POA: Diagnosis not present

## 2016-04-28 DIAGNOSIS — K219 Gastro-esophageal reflux disease without esophagitis: Secondary | ICD-10-CM | POA: Diagnosis not present

## 2016-04-28 DIAGNOSIS — Z23 Encounter for immunization: Secondary | ICD-10-CM | POA: Diagnosis not present

## 2016-04-28 DIAGNOSIS — E119 Type 2 diabetes mellitus without complications: Secondary | ICD-10-CM | POA: Diagnosis not present

## 2016-04-28 DIAGNOSIS — K8080 Other cholelithiasis without obstruction: Secondary | ICD-10-CM | POA: Diagnosis not present

## 2016-04-28 DIAGNOSIS — R1013 Epigastric pain: Secondary | ICD-10-CM | POA: Diagnosis not present

## 2016-04-28 DIAGNOSIS — J309 Allergic rhinitis, unspecified: Secondary | ICD-10-CM | POA: Diagnosis not present

## 2016-04-28 DIAGNOSIS — E860 Dehydration: Secondary | ICD-10-CM | POA: Diagnosis not present

## 2016-04-28 DIAGNOSIS — E785 Hyperlipidemia, unspecified: Secondary | ICD-10-CM | POA: Diagnosis not present

## 2016-04-28 DIAGNOSIS — R7989 Other specified abnormal findings of blood chemistry: Secondary | ICD-10-CM | POA: Diagnosis not present

## 2016-04-28 DIAGNOSIS — E039 Hypothyroidism, unspecified: Secondary | ICD-10-CM | POA: Diagnosis not present

## 2016-04-28 DIAGNOSIS — J3089 Other allergic rhinitis: Secondary | ICD-10-CM | POA: Diagnosis not present

## 2016-04-28 DIAGNOSIS — I5022 Chronic systolic (congestive) heart failure: Secondary | ICD-10-CM | POA: Diagnosis not present

## 2016-04-28 DIAGNOSIS — R079 Chest pain, unspecified: Secondary | ICD-10-CM | POA: Diagnosis not present

## 2016-04-28 DIAGNOSIS — Z91030 Bee allergy status: Secondary | ICD-10-CM | POA: Diagnosis not present

## 2016-04-28 DIAGNOSIS — Z888 Allergy status to other drugs, medicaments and biological substances status: Secondary | ICD-10-CM | POA: Diagnosis not present

## 2016-04-28 DIAGNOSIS — I10 Essential (primary) hypertension: Secondary | ICD-10-CM | POA: Diagnosis not present

## 2016-04-28 DIAGNOSIS — K8020 Calculus of gallbladder without cholecystitis without obstruction: Secondary | ICD-10-CM | POA: Diagnosis not present

## 2016-04-28 DIAGNOSIS — I482 Chronic atrial fibrillation: Secondary | ICD-10-CM | POA: Diagnosis not present

## 2016-04-28 DIAGNOSIS — F0390 Unspecified dementia without behavioral disturbance: Secondary | ICD-10-CM | POA: Diagnosis not present

## 2016-04-28 DIAGNOSIS — Z79899 Other long term (current) drug therapy: Secondary | ICD-10-CM | POA: Diagnosis not present

## 2016-04-29 DIAGNOSIS — E119 Type 2 diabetes mellitus without complications: Secondary | ICD-10-CM | POA: Diagnosis not present

## 2016-04-29 DIAGNOSIS — J3089 Other allergic rhinitis: Secondary | ICD-10-CM | POA: Diagnosis not present

## 2016-04-29 DIAGNOSIS — R0789 Other chest pain: Secondary | ICD-10-CM | POA: Diagnosis not present

## 2016-04-29 DIAGNOSIS — F0280 Dementia in other diseases classified elsewhere without behavioral disturbance: Secondary | ICD-10-CM | POA: Diagnosis not present

## 2016-04-29 DIAGNOSIS — I5022 Chronic systolic (congestive) heart failure: Secondary | ICD-10-CM | POA: Diagnosis not present

## 2016-05-02 DIAGNOSIS — I509 Heart failure, unspecified: Secondary | ICD-10-CM | POA: Diagnosis not present

## 2016-05-02 DIAGNOSIS — N400 Enlarged prostate without lower urinary tract symptoms: Secondary | ICD-10-CM | POA: Diagnosis not present

## 2016-05-02 DIAGNOSIS — N429 Disorder of prostate, unspecified: Secondary | ICD-10-CM | POA: Diagnosis not present

## 2016-05-02 DIAGNOSIS — K219 Gastro-esophageal reflux disease without esophagitis: Secondary | ICD-10-CM | POA: Diagnosis not present

## 2016-05-02 DIAGNOSIS — Z79899 Other long term (current) drug therapy: Secondary | ICD-10-CM | POA: Diagnosis not present

## 2016-05-02 DIAGNOSIS — N402 Nodular prostate without lower urinary tract symptoms: Secondary | ICD-10-CM | POA: Diagnosis not present

## 2016-05-02 DIAGNOSIS — I4891 Unspecified atrial fibrillation: Secondary | ICD-10-CM | POA: Diagnosis not present

## 2016-05-02 DIAGNOSIS — E119 Type 2 diabetes mellitus without complications: Secondary | ICD-10-CM | POA: Diagnosis not present

## 2016-05-02 DIAGNOSIS — Z794 Long term (current) use of insulin: Secondary | ICD-10-CM | POA: Diagnosis not present

## 2016-05-02 DIAGNOSIS — S199XXA Unspecified injury of neck, initial encounter: Secondary | ICD-10-CM | POA: Diagnosis not present

## 2016-05-02 DIAGNOSIS — T45515A Adverse effect of anticoagulants, initial encounter: Secondary | ICD-10-CM | POA: Diagnosis not present

## 2016-05-02 DIAGNOSIS — N1330 Unspecified hydronephrosis: Secondary | ICD-10-CM | POA: Diagnosis not present

## 2016-05-02 DIAGNOSIS — S0993XA Unspecified injury of face, initial encounter: Secondary | ICD-10-CM | POA: Diagnosis not present

## 2016-05-02 DIAGNOSIS — R1031 Right lower quadrant pain: Secondary | ICD-10-CM | POA: Diagnosis not present

## 2016-05-02 DIAGNOSIS — R197 Diarrhea, unspecified: Secondary | ICD-10-CM | POA: Diagnosis not present

## 2016-05-02 DIAGNOSIS — K8020 Calculus of gallbladder without cholecystitis without obstruction: Secondary | ICD-10-CM | POA: Diagnosis not present

## 2016-05-02 DIAGNOSIS — Z7901 Long term (current) use of anticoagulants: Secondary | ICD-10-CM | POA: Diagnosis not present

## 2016-05-02 DIAGNOSIS — F419 Anxiety disorder, unspecified: Secondary | ICD-10-CM | POA: Diagnosis not present

## 2016-05-02 DIAGNOSIS — I110 Hypertensive heart disease with heart failure: Secondary | ICD-10-CM | POA: Diagnosis not present

## 2016-05-02 DIAGNOSIS — Z8249 Family history of ischemic heart disease and other diseases of the circulatory system: Secondary | ICD-10-CM | POA: Diagnosis not present

## 2016-05-02 DIAGNOSIS — T887XXA Unspecified adverse effect of drug or medicament, initial encounter: Secondary | ICD-10-CM | POA: Diagnosis not present

## 2016-05-02 DIAGNOSIS — E039 Hypothyroidism, unspecified: Secondary | ICD-10-CM | POA: Diagnosis not present

## 2016-05-02 DIAGNOSIS — Z87891 Personal history of nicotine dependence: Secondary | ICD-10-CM | POA: Diagnosis not present

## 2016-05-02 DIAGNOSIS — E7800 Pure hypercholesterolemia, unspecified: Secondary | ICD-10-CM | POA: Diagnosis not present

## 2016-05-02 DIAGNOSIS — R188 Other ascites: Secondary | ICD-10-CM | POA: Diagnosis not present

## 2016-05-02 DIAGNOSIS — Z833 Family history of diabetes mellitus: Secondary | ICD-10-CM | POA: Diagnosis not present

## 2016-05-02 DIAGNOSIS — R1032 Left lower quadrant pain: Secondary | ICD-10-CM | POA: Diagnosis not present

## 2016-05-02 DIAGNOSIS — S0990XA Unspecified injury of head, initial encounter: Secondary | ICD-10-CM | POA: Diagnosis not present

## 2016-05-02 DIAGNOSIS — F329 Major depressive disorder, single episode, unspecified: Secondary | ICD-10-CM | POA: Diagnosis not present

## 2016-05-02 DIAGNOSIS — N3289 Other specified disorders of bladder: Secondary | ICD-10-CM | POA: Diagnosis not present

## 2016-05-02 DIAGNOSIS — E041 Nontoxic single thyroid nodule: Secondary | ICD-10-CM | POA: Diagnosis not present

## 2016-05-03 DIAGNOSIS — Z743 Need for continuous supervision: Secondary | ICD-10-CM | POA: Diagnosis not present

## 2016-05-03 DIAGNOSIS — R279 Unspecified lack of coordination: Secondary | ICD-10-CM | POA: Diagnosis not present

## 2016-05-06 DIAGNOSIS — I509 Heart failure, unspecified: Secondary | ICD-10-CM | POA: Diagnosis not present

## 2016-05-06 DIAGNOSIS — Z794 Long term (current) use of insulin: Secondary | ICD-10-CM | POA: Diagnosis not present

## 2016-05-06 DIAGNOSIS — Z7901 Long term (current) use of anticoagulants: Secondary | ICD-10-CM | POA: Diagnosis not present

## 2016-05-06 DIAGNOSIS — R5383 Other fatigue: Secondary | ICD-10-CM | POA: Diagnosis not present

## 2016-05-06 DIAGNOSIS — I4891 Unspecified atrial fibrillation: Secondary | ICD-10-CM | POA: Diagnosis not present

## 2016-05-06 DIAGNOSIS — Z87891 Personal history of nicotine dependence: Secondary | ICD-10-CM | POA: Diagnosis not present

## 2016-05-06 DIAGNOSIS — K219 Gastro-esophageal reflux disease without esophagitis: Secondary | ICD-10-CM | POA: Diagnosis not present

## 2016-05-06 DIAGNOSIS — R279 Unspecified lack of coordination: Secondary | ICD-10-CM | POA: Diagnosis not present

## 2016-05-06 DIAGNOSIS — E876 Hypokalemia: Secondary | ICD-10-CM | POA: Diagnosis not present

## 2016-05-06 DIAGNOSIS — Z7401 Bed confinement status: Secondary | ICD-10-CM | POA: Diagnosis not present

## 2016-05-06 DIAGNOSIS — E11649 Type 2 diabetes mellitus with hypoglycemia without coma: Secondary | ICD-10-CM | POA: Diagnosis not present

## 2016-05-06 DIAGNOSIS — Z95810 Presence of automatic (implantable) cardiac defibrillator: Secondary | ICD-10-CM | POA: Diagnosis not present

## 2016-05-06 DIAGNOSIS — I10 Essential (primary) hypertension: Secondary | ICD-10-CM | POA: Diagnosis not present

## 2016-05-06 DIAGNOSIS — R0682 Tachypnea, not elsewhere classified: Secondary | ICD-10-CM | POA: Diagnosis not present

## 2016-05-06 DIAGNOSIS — Z833 Family history of diabetes mellitus: Secondary | ICD-10-CM | POA: Diagnosis not present

## 2016-05-06 DIAGNOSIS — N400 Enlarged prostate without lower urinary tract symptoms: Secondary | ICD-10-CM | POA: Diagnosis not present

## 2016-05-06 DIAGNOSIS — I110 Hypertensive heart disease with heart failure: Secondary | ICD-10-CM | POA: Diagnosis not present

## 2016-05-06 DIAGNOSIS — R531 Weakness: Secondary | ICD-10-CM | POA: Diagnosis not present

## 2016-05-06 DIAGNOSIS — E875 Hyperkalemia: Secondary | ICD-10-CM | POA: Diagnosis not present

## 2016-05-06 DIAGNOSIS — F329 Major depressive disorder, single episode, unspecified: Secondary | ICD-10-CM | POA: Diagnosis not present

## 2016-05-06 DIAGNOSIS — R140 Abdominal distension (gaseous): Secondary | ICD-10-CM | POA: Diagnosis not present

## 2016-05-07 DIAGNOSIS — K219 Gastro-esophageal reflux disease without esophagitis: Secondary | ICD-10-CM | POA: Diagnosis not present

## 2016-05-07 DIAGNOSIS — F0280 Dementia in other diseases classified elsewhere without behavioral disturbance: Secondary | ICD-10-CM | POA: Diagnosis not present

## 2016-05-07 DIAGNOSIS — E1165 Type 2 diabetes mellitus with hyperglycemia: Secondary | ICD-10-CM | POA: Diagnosis not present

## 2016-05-07 DIAGNOSIS — I482 Chronic atrial fibrillation: Secondary | ICD-10-CM | POA: Diagnosis not present

## 2016-05-07 DIAGNOSIS — Z0000 Encounter for general adult medical examination without abnormal findings: Secondary | ICD-10-CM | POA: Diagnosis not present

## 2016-05-07 DIAGNOSIS — N4289 Other specified disorders of prostate: Secondary | ICD-10-CM | POA: Diagnosis not present

## 2016-05-07 DIAGNOSIS — E038 Other specified hypothyroidism: Secondary | ICD-10-CM | POA: Diagnosis not present

## 2016-05-07 DIAGNOSIS — Z1389 Encounter for screening for other disorder: Secondary | ICD-10-CM | POA: Diagnosis not present

## 2016-05-07 DIAGNOSIS — F338 Other recurrent depressive disorders: Secondary | ICD-10-CM | POA: Diagnosis not present

## 2016-05-07 DIAGNOSIS — Z125 Encounter for screening for malignant neoplasm of prostate: Secondary | ICD-10-CM | POA: Diagnosis not present

## 2016-05-07 DIAGNOSIS — I10 Essential (primary) hypertension: Secondary | ICD-10-CM | POA: Diagnosis not present

## 2016-05-07 DIAGNOSIS — I208 Other forms of angina pectoris: Secondary | ICD-10-CM | POA: Diagnosis not present

## 2016-05-18 DIAGNOSIS — Z9289 Personal history of other medical treatment: Secondary | ICD-10-CM | POA: Diagnosis not present

## 2016-05-18 DIAGNOSIS — R972 Elevated prostate specific antigen [PSA]: Secondary | ICD-10-CM | POA: Diagnosis not present

## 2016-05-18 DIAGNOSIS — R339 Retention of urine, unspecified: Secondary | ICD-10-CM | POA: Diagnosis not present

## 2016-05-18 DIAGNOSIS — R3981 Functional urinary incontinence: Secondary | ICD-10-CM | POA: Diagnosis not present

## 2016-05-21 DIAGNOSIS — E119 Type 2 diabetes mellitus without complications: Secondary | ICD-10-CM | POA: Diagnosis not present

## 2016-05-21 DIAGNOSIS — Z87891 Personal history of nicotine dependence: Secondary | ICD-10-CM | POA: Diagnosis not present

## 2016-05-21 DIAGNOSIS — Z95810 Presence of automatic (implantable) cardiac defibrillator: Secondary | ICD-10-CM | POA: Diagnosis not present

## 2016-05-21 DIAGNOSIS — Z8249 Family history of ischemic heart disease and other diseases of the circulatory system: Secondary | ICD-10-CM | POA: Diagnosis not present

## 2016-05-21 DIAGNOSIS — Z794 Long term (current) use of insulin: Secondary | ICD-10-CM | POA: Diagnosis not present

## 2016-05-21 DIAGNOSIS — S069X1A Unspecified intracranial injury with loss of consciousness of 30 minutes or less, initial encounter: Secondary | ICD-10-CM | POA: Diagnosis not present

## 2016-05-21 DIAGNOSIS — R51 Headache: Secondary | ICD-10-CM | POA: Diagnosis not present

## 2016-05-21 DIAGNOSIS — F329 Major depressive disorder, single episode, unspecified: Secondary | ICD-10-CM | POA: Diagnosis not present

## 2016-05-21 DIAGNOSIS — S098XXA Other specified injuries of head, initial encounter: Secondary | ICD-10-CM | POA: Diagnosis not present

## 2016-05-21 DIAGNOSIS — N400 Enlarged prostate without lower urinary tract symptoms: Secondary | ICD-10-CM | POA: Diagnosis not present

## 2016-05-21 DIAGNOSIS — S0990XA Unspecified injury of head, initial encounter: Secondary | ICD-10-CM | POA: Diagnosis not present

## 2016-05-21 DIAGNOSIS — Z7901 Long term (current) use of anticoagulants: Secondary | ICD-10-CM | POA: Diagnosis not present

## 2016-05-21 DIAGNOSIS — R220 Localized swelling, mass and lump, head: Secondary | ICD-10-CM | POA: Diagnosis not present

## 2016-05-21 DIAGNOSIS — K219 Gastro-esophageal reflux disease without esophagitis: Secondary | ICD-10-CM | POA: Diagnosis not present

## 2016-05-21 DIAGNOSIS — I4891 Unspecified atrial fibrillation: Secondary | ICD-10-CM | POA: Diagnosis not present

## 2016-05-21 DIAGNOSIS — I509 Heart failure, unspecified: Secondary | ICD-10-CM | POA: Diagnosis not present

## 2016-05-21 DIAGNOSIS — Z833 Family history of diabetes mellitus: Secondary | ICD-10-CM | POA: Diagnosis not present

## 2016-05-21 DIAGNOSIS — D689 Coagulation defect, unspecified: Secondary | ICD-10-CM | POA: Diagnosis not present

## 2016-05-21 DIAGNOSIS — W06XXXA Fall from bed, initial encounter: Secondary | ICD-10-CM | POA: Diagnosis not present

## 2016-05-21 DIAGNOSIS — I110 Hypertensive heart disease with heart failure: Secondary | ICD-10-CM | POA: Diagnosis not present

## 2016-05-26 DIAGNOSIS — N4289 Other specified disorders of prostate: Secondary | ICD-10-CM | POA: Diagnosis not present

## 2016-05-26 DIAGNOSIS — I10 Essential (primary) hypertension: Secondary | ICD-10-CM | POA: Diagnosis not present

## 2016-05-26 DIAGNOSIS — E784 Other hyperlipidemia: Secondary | ICD-10-CM | POA: Diagnosis not present

## 2016-05-26 DIAGNOSIS — I482 Chronic atrial fibrillation: Secondary | ICD-10-CM | POA: Diagnosis not present

## 2016-05-26 DIAGNOSIS — E1165 Type 2 diabetes mellitus with hyperglycemia: Secondary | ICD-10-CM | POA: Diagnosis not present

## 2016-06-18 DIAGNOSIS — Z833 Family history of diabetes mellitus: Secondary | ICD-10-CM | POA: Diagnosis not present

## 2016-06-18 DIAGNOSIS — K8020 Calculus of gallbladder without cholecystitis without obstruction: Secondary | ICD-10-CM | POA: Diagnosis not present

## 2016-06-18 DIAGNOSIS — E039 Hypothyroidism, unspecified: Secondary | ICD-10-CM | POA: Diagnosis not present

## 2016-06-18 DIAGNOSIS — E119 Type 2 diabetes mellitus without complications: Secondary | ICD-10-CM | POA: Diagnosis not present

## 2016-06-18 DIAGNOSIS — I10 Essential (primary) hypertension: Secondary | ICD-10-CM | POA: Diagnosis not present

## 2016-06-18 DIAGNOSIS — F0390 Unspecified dementia without behavioral disturbance: Secondary | ICD-10-CM | POA: Diagnosis not present

## 2016-06-18 DIAGNOSIS — F0280 Dementia in other diseases classified elsewhere without behavioral disturbance: Secondary | ICD-10-CM | POA: Diagnosis not present

## 2016-06-18 DIAGNOSIS — Z888 Allergy status to other drugs, medicaments and biological substances status: Secondary | ICD-10-CM | POA: Diagnosis not present

## 2016-06-18 DIAGNOSIS — R319 Hematuria, unspecified: Secondary | ICD-10-CM | POA: Diagnosis not present

## 2016-06-18 DIAGNOSIS — J309 Allergic rhinitis, unspecified: Secondary | ICD-10-CM | POA: Diagnosis not present

## 2016-06-18 DIAGNOSIS — Z87891 Personal history of nicotine dependence: Secondary | ICD-10-CM | POA: Diagnosis not present

## 2016-06-18 DIAGNOSIS — D6832 Hemorrhagic disorder due to extrinsic circulating anticoagulants: Secondary | ICD-10-CM | POA: Diagnosis not present

## 2016-06-18 DIAGNOSIS — R791 Abnormal coagulation profile: Secondary | ICD-10-CM | POA: Diagnosis not present

## 2016-06-18 DIAGNOSIS — R310 Gross hematuria: Secondary | ICD-10-CM | POA: Diagnosis not present

## 2016-06-18 DIAGNOSIS — I482 Chronic atrial fibrillation: Secondary | ICD-10-CM | POA: Diagnosis not present

## 2016-06-18 DIAGNOSIS — E785 Hyperlipidemia, unspecified: Secondary | ICD-10-CM | POA: Diagnosis not present

## 2016-06-18 DIAGNOSIS — J3089 Other allergic rhinitis: Secondary | ICD-10-CM | POA: Diagnosis not present

## 2016-06-18 DIAGNOSIS — N400 Enlarged prostate without lower urinary tract symptoms: Secondary | ICD-10-CM | POA: Diagnosis not present

## 2016-06-18 DIAGNOSIS — N390 Urinary tract infection, site not specified: Secondary | ICD-10-CM | POA: Diagnosis not present

## 2016-06-18 DIAGNOSIS — K219 Gastro-esophageal reflux disease without esophagitis: Secondary | ICD-10-CM | POA: Diagnosis not present

## 2016-06-18 DIAGNOSIS — E860 Dehydration: Secondary | ICD-10-CM | POA: Diagnosis not present

## 2016-06-18 DIAGNOSIS — Z8249 Family history of ischemic heart disease and other diseases of the circulatory system: Secondary | ICD-10-CM | POA: Diagnosis not present

## 2016-06-18 DIAGNOSIS — I5022 Chronic systolic (congestive) heart failure: Secondary | ICD-10-CM | POA: Diagnosis not present

## 2016-06-18 DIAGNOSIS — R1031 Right lower quadrant pain: Secondary | ICD-10-CM | POA: Diagnosis not present

## 2016-06-18 DIAGNOSIS — F329 Major depressive disorder, single episode, unspecified: Secondary | ICD-10-CM | POA: Diagnosis not present

## 2016-06-19 DIAGNOSIS — I5022 Chronic systolic (congestive) heart failure: Secondary | ICD-10-CM | POA: Diagnosis not present

## 2016-06-19 DIAGNOSIS — F0280 Dementia in other diseases classified elsewhere without behavioral disturbance: Secondary | ICD-10-CM | POA: Diagnosis not present

## 2016-06-19 DIAGNOSIS — J3089 Other allergic rhinitis: Secondary | ICD-10-CM | POA: Diagnosis not present

## 2016-06-19 DIAGNOSIS — R310 Gross hematuria: Secondary | ICD-10-CM | POA: Diagnosis not present

## 2016-06-19 DIAGNOSIS — R791 Abnormal coagulation profile: Secondary | ICD-10-CM | POA: Diagnosis not present

## 2016-06-20 DIAGNOSIS — R791 Abnormal coagulation profile: Secondary | ICD-10-CM | POA: Diagnosis not present

## 2016-06-20 DIAGNOSIS — R279 Unspecified lack of coordination: Secondary | ICD-10-CM | POA: Diagnosis not present

## 2016-06-20 DIAGNOSIS — J3089 Other allergic rhinitis: Secondary | ICD-10-CM | POA: Diagnosis not present

## 2016-06-20 DIAGNOSIS — R310 Gross hematuria: Secondary | ICD-10-CM | POA: Diagnosis not present

## 2016-06-20 DIAGNOSIS — Z7401 Bed confinement status: Secondary | ICD-10-CM | POA: Diagnosis not present

## 2016-06-20 DIAGNOSIS — F0280 Dementia in other diseases classified elsewhere without behavioral disturbance: Secondary | ICD-10-CM | POA: Diagnosis not present

## 2016-06-20 DIAGNOSIS — I5022 Chronic systolic (congestive) heart failure: Secondary | ICD-10-CM | POA: Diagnosis not present

## 2016-06-21 DIAGNOSIS — I4891 Unspecified atrial fibrillation: Secondary | ICD-10-CM | POA: Diagnosis not present

## 2016-06-21 DIAGNOSIS — R1030 Lower abdominal pain, unspecified: Secondary | ICD-10-CM | POA: Diagnosis not present

## 2016-06-21 DIAGNOSIS — R339 Retention of urine, unspecified: Secondary | ICD-10-CM | POA: Diagnosis not present

## 2016-06-21 DIAGNOSIS — I110 Hypertensive heart disease with heart failure: Secondary | ICD-10-CM | POA: Diagnosis not present

## 2016-06-21 DIAGNOSIS — R1031 Right lower quadrant pain: Secondary | ICD-10-CM | POA: Diagnosis not present

## 2016-06-21 DIAGNOSIS — M5489 Other dorsalgia: Secondary | ICD-10-CM | POA: Diagnosis not present

## 2016-06-21 DIAGNOSIS — Z87891 Personal history of nicotine dependence: Secondary | ICD-10-CM | POA: Diagnosis not present

## 2016-06-21 DIAGNOSIS — F0390 Unspecified dementia without behavioral disturbance: Secondary | ICD-10-CM | POA: Diagnosis not present

## 2016-06-21 DIAGNOSIS — F05 Delirium due to known physiological condition: Secondary | ICD-10-CM | POA: Diagnosis not present

## 2016-06-21 DIAGNOSIS — Z743 Need for continuous supervision: Secondary | ICD-10-CM | POA: Diagnosis not present

## 2016-06-21 DIAGNOSIS — Z9119 Patient's noncompliance with other medical treatment and regimen: Secondary | ICD-10-CM | POA: Diagnosis not present

## 2016-06-21 DIAGNOSIS — E119 Type 2 diabetes mellitus without complications: Secondary | ICD-10-CM | POA: Diagnosis not present

## 2016-06-21 DIAGNOSIS — Z833 Family history of diabetes mellitus: Secondary | ICD-10-CM | POA: Diagnosis not present

## 2016-06-21 DIAGNOSIS — Z95810 Presence of automatic (implantable) cardiac defibrillator: Secondary | ICD-10-CM | POA: Diagnosis not present

## 2016-06-21 DIAGNOSIS — F329 Major depressive disorder, single episode, unspecified: Secondary | ICD-10-CM | POA: Diagnosis not present

## 2016-06-21 DIAGNOSIS — I509 Heart failure, unspecified: Secondary | ICD-10-CM | POA: Diagnosis not present

## 2016-07-01 DIAGNOSIS — K219 Gastro-esophageal reflux disease without esophagitis: Secondary | ICD-10-CM | POA: Diagnosis not present

## 2016-07-01 DIAGNOSIS — D638 Anemia in other chronic diseases classified elsewhere: Secondary | ICD-10-CM | POA: Diagnosis not present

## 2016-07-01 DIAGNOSIS — J309 Allergic rhinitis, unspecified: Secondary | ICD-10-CM | POA: Diagnosis not present

## 2016-07-01 DIAGNOSIS — R1031 Right lower quadrant pain: Secondary | ICD-10-CM | POA: Diagnosis not present

## 2016-07-01 DIAGNOSIS — Z794 Long term (current) use of insulin: Secondary | ICD-10-CM | POA: Diagnosis not present

## 2016-07-01 DIAGNOSIS — E039 Hypothyroidism, unspecified: Secondary | ICD-10-CM | POA: Diagnosis not present

## 2016-07-01 DIAGNOSIS — E785 Hyperlipidemia, unspecified: Secondary | ICD-10-CM | POA: Diagnosis not present

## 2016-07-01 DIAGNOSIS — F339 Major depressive disorder, recurrent, unspecified: Secondary | ICD-10-CM | POA: Diagnosis not present

## 2016-07-01 DIAGNOSIS — Z91030 Bee allergy status: Secondary | ICD-10-CM | POA: Diagnosis not present

## 2016-07-01 DIAGNOSIS — F0390 Unspecified dementia without behavioral disturbance: Secondary | ICD-10-CM | POA: Diagnosis not present

## 2016-07-01 DIAGNOSIS — Z888 Allergy status to other drugs, medicaments and biological substances status: Secondary | ICD-10-CM | POA: Diagnosis not present

## 2016-07-01 DIAGNOSIS — J9811 Atelectasis: Secondary | ICD-10-CM | POA: Diagnosis not present

## 2016-07-01 DIAGNOSIS — D6489 Other specified anemias: Secondary | ICD-10-CM | POA: Diagnosis not present

## 2016-07-01 DIAGNOSIS — I5022 Chronic systolic (congestive) heart failure: Secondary | ICD-10-CM | POA: Diagnosis not present

## 2016-07-01 DIAGNOSIS — I110 Hypertensive heart disease with heart failure: Secondary | ICD-10-CM | POA: Diagnosis not present

## 2016-07-01 DIAGNOSIS — K8020 Calculus of gallbladder without cholecystitis without obstruction: Secondary | ICD-10-CM | POA: Diagnosis not present

## 2016-07-01 DIAGNOSIS — K6289 Other specified diseases of anus and rectum: Secondary | ICD-10-CM | POA: Diagnosis not present

## 2016-07-01 DIAGNOSIS — E119 Type 2 diabetes mellitus without complications: Secondary | ICD-10-CM | POA: Diagnosis not present

## 2016-07-01 DIAGNOSIS — N400 Enlarged prostate without lower urinary tract symptoms: Secondary | ICD-10-CM | POA: Diagnosis not present

## 2016-07-01 DIAGNOSIS — B965 Pseudomonas (aeruginosa) (mallei) (pseudomallei) as the cause of diseases classified elsewhere: Secondary | ICD-10-CM | POA: Diagnosis not present

## 2016-07-01 DIAGNOSIS — R1011 Right upper quadrant pain: Secondary | ICD-10-CM | POA: Diagnosis not present

## 2016-07-01 DIAGNOSIS — N390 Urinary tract infection, site not specified: Secondary | ICD-10-CM | POA: Diagnosis not present

## 2016-07-01 DIAGNOSIS — I482 Chronic atrial fibrillation: Secondary | ICD-10-CM | POA: Diagnosis not present

## 2016-07-01 DIAGNOSIS — Z79899 Other long term (current) drug therapy: Secondary | ICD-10-CM | POA: Diagnosis not present

## 2016-07-02 DIAGNOSIS — I110 Hypertensive heart disease with heart failure: Secondary | ICD-10-CM | POA: Diagnosis not present

## 2016-07-02 DIAGNOSIS — N390 Urinary tract infection, site not specified: Secondary | ICD-10-CM | POA: Diagnosis not present

## 2016-07-02 DIAGNOSIS — D638 Anemia in other chronic diseases classified elsewhere: Secondary | ICD-10-CM | POA: Diagnosis not present

## 2016-07-02 DIAGNOSIS — K6289 Other specified diseases of anus and rectum: Secondary | ICD-10-CM | POA: Diagnosis not present

## 2016-07-02 DIAGNOSIS — B965 Pseudomonas (aeruginosa) (mallei) (pseudomallei) as the cause of diseases classified elsewhere: Secondary | ICD-10-CM | POA: Diagnosis not present

## 2016-07-03 DIAGNOSIS — B965 Pseudomonas (aeruginosa) (mallei) (pseudomallei) as the cause of diseases classified elsewhere: Secondary | ICD-10-CM | POA: Diagnosis not present

## 2016-07-03 DIAGNOSIS — K6289 Other specified diseases of anus and rectum: Secondary | ICD-10-CM | POA: Diagnosis not present

## 2016-07-03 DIAGNOSIS — N390 Urinary tract infection, site not specified: Secondary | ICD-10-CM | POA: Diagnosis not present

## 2016-07-03 DIAGNOSIS — I110 Hypertensive heart disease with heart failure: Secondary | ICD-10-CM | POA: Diagnosis not present

## 2016-07-03 DIAGNOSIS — D638 Anemia in other chronic diseases classified elsewhere: Secondary | ICD-10-CM | POA: Diagnosis not present

## 2016-07-04 DIAGNOSIS — E119 Type 2 diabetes mellitus without complications: Secondary | ICD-10-CM | POA: Diagnosis not present

## 2016-07-04 DIAGNOSIS — Z794 Long term (current) use of insulin: Secondary | ICD-10-CM | POA: Diagnosis not present

## 2016-07-04 DIAGNOSIS — R0789 Other chest pain: Secondary | ICD-10-CM | POA: Diagnosis not present

## 2016-07-04 DIAGNOSIS — N390 Urinary tract infection, site not specified: Secondary | ICD-10-CM | POA: Diagnosis not present

## 2016-07-04 DIAGNOSIS — K8020 Calculus of gallbladder without cholecystitis without obstruction: Secondary | ICD-10-CM | POA: Diagnosis not present

## 2016-07-04 DIAGNOSIS — I110 Hypertensive heart disease with heart failure: Secondary | ICD-10-CM | POA: Diagnosis not present

## 2016-07-04 DIAGNOSIS — R079 Chest pain, unspecified: Secondary | ICD-10-CM | POA: Diagnosis not present

## 2016-07-04 DIAGNOSIS — Z95810 Presence of automatic (implantable) cardiac defibrillator: Secondary | ICD-10-CM | POA: Diagnosis not present

## 2016-07-04 DIAGNOSIS — J90 Pleural effusion, not elsewhere classified: Secondary | ICD-10-CM | POA: Diagnosis not present

## 2016-07-04 DIAGNOSIS — K219 Gastro-esophageal reflux disease without esophagitis: Secondary | ICD-10-CM | POA: Diagnosis not present

## 2016-07-04 DIAGNOSIS — J9811 Atelectasis: Secondary | ICD-10-CM | POA: Diagnosis not present

## 2016-07-04 DIAGNOSIS — R0989 Other specified symptoms and signs involving the circulatory and respiratory systems: Secondary | ICD-10-CM | POA: Diagnosis not present

## 2016-07-04 DIAGNOSIS — R1011 Right upper quadrant pain: Secondary | ICD-10-CM | POA: Diagnosis not present

## 2016-07-04 DIAGNOSIS — I509 Heart failure, unspecified: Secondary | ICD-10-CM | POA: Diagnosis not present

## 2016-07-04 DIAGNOSIS — Z79899 Other long term (current) drug therapy: Secondary | ICD-10-CM | POA: Diagnosis not present

## 2016-07-04 DIAGNOSIS — R05 Cough: Secondary | ICD-10-CM | POA: Diagnosis not present

## 2016-07-04 DIAGNOSIS — Z87891 Personal history of nicotine dependence: Secondary | ICD-10-CM | POA: Diagnosis not present

## 2016-07-04 DIAGNOSIS — E039 Hypothyroidism, unspecified: Secondary | ICD-10-CM | POA: Diagnosis not present

## 2016-07-04 DIAGNOSIS — R110 Nausea: Secondary | ICD-10-CM | POA: Diagnosis not present

## 2016-07-12 DIAGNOSIS — E1165 Type 2 diabetes mellitus with hyperglycemia: Secondary | ICD-10-CM | POA: Diagnosis not present

## 2016-07-12 DIAGNOSIS — K219 Gastro-esophageal reflux disease without esophagitis: Secondary | ICD-10-CM | POA: Diagnosis not present

## 2016-07-12 DIAGNOSIS — F338 Other recurrent depressive disorders: Secondary | ICD-10-CM | POA: Diagnosis not present

## 2016-07-12 DIAGNOSIS — R310 Gross hematuria: Secondary | ICD-10-CM | POA: Diagnosis not present

## 2016-07-12 DIAGNOSIS — I482 Chronic atrial fibrillation: Secondary | ICD-10-CM | POA: Diagnosis not present

## 2016-07-12 DIAGNOSIS — N4289 Other specified disorders of prostate: Secondary | ICD-10-CM | POA: Diagnosis not present

## 2016-07-21 DIAGNOSIS — E1165 Type 2 diabetes mellitus with hyperglycemia: Secondary | ICD-10-CM | POA: Diagnosis not present

## 2016-07-21 DIAGNOSIS — F338 Other recurrent depressive disorders: Secondary | ICD-10-CM | POA: Diagnosis not present

## 2016-07-21 DIAGNOSIS — I482 Chronic atrial fibrillation: Secondary | ICD-10-CM | POA: Diagnosis not present

## 2016-07-21 DIAGNOSIS — Z466 Encounter for fitting and adjustment of urinary device: Secondary | ICD-10-CM | POA: Diagnosis not present

## 2016-07-21 DIAGNOSIS — R2689 Other abnormalities of gait and mobility: Secondary | ICD-10-CM | POA: Diagnosis not present

## 2016-07-21 DIAGNOSIS — Z87440 Personal history of urinary (tract) infections: Secondary | ICD-10-CM | POA: Diagnosis not present

## 2016-07-22 DIAGNOSIS — Z7901 Long term (current) use of anticoagulants: Secondary | ICD-10-CM | POA: Diagnosis not present

## 2016-07-22 DIAGNOSIS — K219 Gastro-esophageal reflux disease without esophagitis: Secondary | ICD-10-CM | POA: Diagnosis not present

## 2016-07-22 DIAGNOSIS — R1030 Lower abdominal pain, unspecified: Secondary | ICD-10-CM | POA: Diagnosis not present

## 2016-07-22 DIAGNOSIS — I509 Heart failure, unspecified: Secondary | ICD-10-CM | POA: Diagnosis not present

## 2016-07-22 DIAGNOSIS — F0390 Unspecified dementia without behavioral disturbance: Secondary | ICD-10-CM | POA: Diagnosis not present

## 2016-07-22 DIAGNOSIS — Z87440 Personal history of urinary (tract) infections: Secondary | ICD-10-CM | POA: Diagnosis not present

## 2016-07-22 DIAGNOSIS — R10813 Right lower quadrant abdominal tenderness: Secondary | ICD-10-CM | POA: Diagnosis not present

## 2016-07-22 DIAGNOSIS — Z79899 Other long term (current) drug therapy: Secondary | ICD-10-CM | POA: Diagnosis not present

## 2016-07-22 DIAGNOSIS — F329 Major depressive disorder, single episode, unspecified: Secondary | ICD-10-CM | POA: Diagnosis not present

## 2016-07-22 DIAGNOSIS — R319 Hematuria, unspecified: Secondary | ICD-10-CM | POA: Diagnosis not present

## 2016-07-22 DIAGNOSIS — N401 Enlarged prostate with lower urinary tract symptoms: Secondary | ICD-10-CM | POA: Diagnosis not present

## 2016-07-22 DIAGNOSIS — E860 Dehydration: Secondary | ICD-10-CM | POA: Diagnosis not present

## 2016-07-22 DIAGNOSIS — I110 Hypertensive heart disease with heart failure: Secondary | ICD-10-CM | POA: Diagnosis not present

## 2016-07-22 DIAGNOSIS — R1031 Right lower quadrant pain: Secondary | ICD-10-CM | POA: Diagnosis not present

## 2016-07-22 DIAGNOSIS — Z794 Long term (current) use of insulin: Secondary | ICD-10-CM | POA: Diagnosis not present

## 2016-07-22 DIAGNOSIS — R339 Retention of urine, unspecified: Secondary | ICD-10-CM | POA: Diagnosis not present

## 2016-07-22 DIAGNOSIS — R338 Other retention of urine: Secondary | ICD-10-CM | POA: Diagnosis not present

## 2016-07-22 DIAGNOSIS — Z87891 Personal history of nicotine dependence: Secondary | ICD-10-CM | POA: Diagnosis not present

## 2016-07-22 DIAGNOSIS — E1165 Type 2 diabetes mellitus with hyperglycemia: Secondary | ICD-10-CM | POA: Diagnosis not present

## 2016-07-22 DIAGNOSIS — E7800 Pure hypercholesterolemia, unspecified: Secondary | ICD-10-CM | POA: Diagnosis not present

## 2016-07-22 DIAGNOSIS — T83098A Other mechanical complication of other indwelling urethral catheter, initial encounter: Secondary | ICD-10-CM | POA: Diagnosis not present

## 2016-07-26 DIAGNOSIS — Z87440 Personal history of urinary (tract) infections: Secondary | ICD-10-CM | POA: Diagnosis not present

## 2016-07-26 DIAGNOSIS — R2689 Other abnormalities of gait and mobility: Secondary | ICD-10-CM | POA: Diagnosis not present

## 2016-07-26 DIAGNOSIS — I482 Chronic atrial fibrillation: Secondary | ICD-10-CM | POA: Diagnosis not present

## 2016-07-26 DIAGNOSIS — Z466 Encounter for fitting and adjustment of urinary device: Secondary | ICD-10-CM | POA: Diagnosis not present

## 2016-07-26 DIAGNOSIS — E1165 Type 2 diabetes mellitus with hyperglycemia: Secondary | ICD-10-CM | POA: Diagnosis not present

## 2016-07-26 DIAGNOSIS — F338 Other recurrent depressive disorders: Secondary | ICD-10-CM | POA: Diagnosis not present

## 2016-07-28 DIAGNOSIS — F0390 Unspecified dementia without behavioral disturbance: Secondary | ICD-10-CM | POA: Diagnosis not present

## 2016-07-28 DIAGNOSIS — Z95810 Presence of automatic (implantable) cardiac defibrillator: Secondary | ICD-10-CM | POA: Diagnosis not present

## 2016-07-28 DIAGNOSIS — R52 Pain, unspecified: Secondary | ICD-10-CM | POA: Diagnosis not present

## 2016-07-28 DIAGNOSIS — I509 Heart failure, unspecified: Secondary | ICD-10-CM | POA: Diagnosis not present

## 2016-07-28 DIAGNOSIS — I110 Hypertensive heart disease with heart failure: Secondary | ICD-10-CM | POA: Diagnosis not present

## 2016-07-28 DIAGNOSIS — R1031 Right lower quadrant pain: Secondary | ICD-10-CM | POA: Diagnosis not present

## 2016-07-28 DIAGNOSIS — Z79899 Other long term (current) drug therapy: Secondary | ICD-10-CM | POA: Diagnosis not present

## 2016-07-28 DIAGNOSIS — F338 Other recurrent depressive disorders: Secondary | ICD-10-CM | POA: Diagnosis not present

## 2016-07-28 DIAGNOSIS — F329 Major depressive disorder, single episode, unspecified: Secondary | ICD-10-CM | POA: Diagnosis not present

## 2016-07-28 DIAGNOSIS — Z794 Long term (current) use of insulin: Secondary | ICD-10-CM | POA: Diagnosis not present

## 2016-07-28 DIAGNOSIS — Z87891 Personal history of nicotine dependence: Secondary | ICD-10-CM | POA: Diagnosis not present

## 2016-07-28 DIAGNOSIS — Z466 Encounter for fitting and adjustment of urinary device: Secondary | ICD-10-CM | POA: Diagnosis not present

## 2016-07-28 DIAGNOSIS — R339 Retention of urine, unspecified: Secondary | ICD-10-CM | POA: Diagnosis not present

## 2016-07-28 DIAGNOSIS — Z7901 Long term (current) use of anticoagulants: Secondary | ICD-10-CM | POA: Diagnosis not present

## 2016-07-28 DIAGNOSIS — I4891 Unspecified atrial fibrillation: Secondary | ICD-10-CM | POA: Diagnosis not present

## 2016-07-28 DIAGNOSIS — R319 Hematuria, unspecified: Secondary | ICD-10-CM | POA: Diagnosis not present

## 2016-07-28 DIAGNOSIS — Z87440 Personal history of urinary (tract) infections: Secondary | ICD-10-CM | POA: Diagnosis not present

## 2016-07-28 DIAGNOSIS — N400 Enlarged prostate without lower urinary tract symptoms: Secondary | ICD-10-CM | POA: Diagnosis not present

## 2016-07-28 DIAGNOSIS — K219 Gastro-esophageal reflux disease without esophagitis: Secondary | ICD-10-CM | POA: Diagnosis not present

## 2016-07-28 DIAGNOSIS — R2689 Other abnormalities of gait and mobility: Secondary | ICD-10-CM | POA: Diagnosis not present

## 2016-07-28 DIAGNOSIS — E1165 Type 2 diabetes mellitus with hyperglycemia: Secondary | ICD-10-CM | POA: Diagnosis not present

## 2016-07-28 DIAGNOSIS — I482 Chronic atrial fibrillation: Secondary | ICD-10-CM | POA: Diagnosis not present

## 2016-07-28 DIAGNOSIS — E7800 Pure hypercholesterolemia, unspecified: Secondary | ICD-10-CM | POA: Diagnosis not present

## 2016-07-28 DIAGNOSIS — E119 Type 2 diabetes mellitus without complications: Secondary | ICD-10-CM | POA: Diagnosis not present

## 2016-07-29 DIAGNOSIS — Z87440 Personal history of urinary (tract) infections: Secondary | ICD-10-CM | POA: Diagnosis not present

## 2016-07-29 DIAGNOSIS — E1165 Type 2 diabetes mellitus with hyperglycemia: Secondary | ICD-10-CM | POA: Diagnosis not present

## 2016-07-29 DIAGNOSIS — F338 Other recurrent depressive disorders: Secondary | ICD-10-CM | POA: Diagnosis not present

## 2016-07-29 DIAGNOSIS — R2689 Other abnormalities of gait and mobility: Secondary | ICD-10-CM | POA: Diagnosis not present

## 2016-07-29 DIAGNOSIS — Z466 Encounter for fitting and adjustment of urinary device: Secondary | ICD-10-CM | POA: Diagnosis not present

## 2016-07-29 DIAGNOSIS — I482 Chronic atrial fibrillation: Secondary | ICD-10-CM | POA: Diagnosis not present

## 2016-07-30 DIAGNOSIS — Z743 Need for continuous supervision: Secondary | ICD-10-CM | POA: Diagnosis not present

## 2016-07-30 DIAGNOSIS — R279 Unspecified lack of coordination: Secondary | ICD-10-CM | POA: Diagnosis not present

## 2016-07-31 DIAGNOSIS — Z7901 Long term (current) use of anticoagulants: Secondary | ICD-10-CM | POA: Diagnosis not present

## 2016-07-31 DIAGNOSIS — F0390 Unspecified dementia without behavioral disturbance: Secondary | ICD-10-CM | POA: Diagnosis not present

## 2016-07-31 DIAGNOSIS — I6782 Cerebral ischemia: Secondary | ICD-10-CM | POA: Diagnosis not present

## 2016-07-31 DIAGNOSIS — R0989 Other specified symptoms and signs involving the circulatory and respiratory systems: Secondary | ICD-10-CM | POA: Diagnosis not present

## 2016-07-31 DIAGNOSIS — N4889 Other specified disorders of penis: Secondary | ICD-10-CM | POA: Diagnosis not present

## 2016-07-31 DIAGNOSIS — F329 Major depressive disorder, single episode, unspecified: Secondary | ICD-10-CM | POA: Diagnosis not present

## 2016-07-31 DIAGNOSIS — R509 Fever, unspecified: Secondary | ICD-10-CM | POA: Diagnosis not present

## 2016-07-31 DIAGNOSIS — N368 Other specified disorders of urethra: Secondary | ICD-10-CM | POA: Diagnosis not present

## 2016-07-31 DIAGNOSIS — N400 Enlarged prostate without lower urinary tract symptoms: Secondary | ICD-10-CM | POA: Diagnosis not present

## 2016-07-31 DIAGNOSIS — Z87891 Personal history of nicotine dependence: Secondary | ICD-10-CM | POA: Diagnosis not present

## 2016-07-31 DIAGNOSIS — Z79899 Other long term (current) drug therapy: Secondary | ICD-10-CM | POA: Diagnosis not present

## 2016-07-31 DIAGNOSIS — R531 Weakness: Secondary | ICD-10-CM | POA: Diagnosis not present

## 2016-07-31 DIAGNOSIS — Z794 Long term (current) use of insulin: Secondary | ICD-10-CM | POA: Diagnosis not present

## 2016-07-31 DIAGNOSIS — E119 Type 2 diabetes mellitus without complications: Secondary | ICD-10-CM | POA: Diagnosis not present

## 2016-07-31 DIAGNOSIS — F4489 Other dissociative and conversion disorders: Secondary | ICD-10-CM | POA: Diagnosis not present

## 2016-07-31 DIAGNOSIS — E7800 Pure hypercholesterolemia, unspecified: Secondary | ICD-10-CM | POA: Diagnosis not present

## 2016-07-31 DIAGNOSIS — K219 Gastro-esophageal reflux disease without esophagitis: Secondary | ICD-10-CM | POA: Diagnosis not present

## 2016-08-01 ENCOUNTER — Encounter (HOSPITAL_COMMUNITY): Payer: Self-pay | Admitting: Emergency Medicine

## 2016-08-01 ENCOUNTER — Emergency Department (HOSPITAL_COMMUNITY)
Admission: EM | Admit: 2016-08-01 | Discharge: 2016-08-02 | Disposition: A | Discharge status: Home / Self Care | Payer: Medicare Other | Attending: Emergency Medicine | Admitting: Emergency Medicine

## 2016-08-01 ENCOUNTER — Emergency Department (HOSPITAL_COMMUNITY): Payer: Medicare Other

## 2016-08-01 DIAGNOSIS — E119 Type 2 diabetes mellitus without complications: Secondary | ICD-10-CM | POA: Diagnosis not present

## 2016-08-01 DIAGNOSIS — I2510 Atherosclerotic heart disease of native coronary artery without angina pectoris: Secondary | ICD-10-CM | POA: Insufficient documentation

## 2016-08-01 DIAGNOSIS — Z87891 Personal history of nicotine dependence: Secondary | ICD-10-CM | POA: Diagnosis not present

## 2016-08-01 DIAGNOSIS — Z7901 Long term (current) use of anticoagulants: Secondary | ICD-10-CM | POA: Diagnosis not present

## 2016-08-01 DIAGNOSIS — R1084 Generalized abdominal pain: Secondary | ICD-10-CM | POA: Diagnosis not present

## 2016-08-01 DIAGNOSIS — F329 Major depressive disorder, single episode, unspecified: Secondary | ICD-10-CM | POA: Diagnosis not present

## 2016-08-01 DIAGNOSIS — R1031 Right lower quadrant pain: Secondary | ICD-10-CM | POA: Diagnosis not present

## 2016-08-01 DIAGNOSIS — Z79899 Other long term (current) drug therapy: Secondary | ICD-10-CM | POA: Insufficient documentation

## 2016-08-01 DIAGNOSIS — I110 Hypertensive heart disease with heart failure: Secondary | ICD-10-CM | POA: Diagnosis not present

## 2016-08-01 DIAGNOSIS — Z794 Long term (current) use of insulin: Secondary | ICD-10-CM | POA: Diagnosis not present

## 2016-08-01 DIAGNOSIS — K8020 Calculus of gallbladder without cholecystitis without obstruction: Secondary | ICD-10-CM | POA: Diagnosis not present

## 2016-08-01 DIAGNOSIS — I4891 Unspecified atrial fibrillation: Secondary | ICD-10-CM | POA: Diagnosis not present

## 2016-08-01 DIAGNOSIS — R0602 Shortness of breath: Secondary | ICD-10-CM | POA: Diagnosis not present

## 2016-08-01 DIAGNOSIS — R0682 Tachypnea, not elsewhere classified: Secondary | ICD-10-CM | POA: Diagnosis not present

## 2016-08-01 DIAGNOSIS — I509 Heart failure, unspecified: Secondary | ICD-10-CM | POA: Diagnosis not present

## 2016-08-01 DIAGNOSIS — I5022 Chronic systolic (congestive) heart failure: Secondary | ICD-10-CM | POA: Diagnosis not present

## 2016-08-01 DIAGNOSIS — K219 Gastro-esophageal reflux disease without esophagitis: Secondary | ICD-10-CM | POA: Diagnosis not present

## 2016-08-01 DIAGNOSIS — Z95810 Presence of automatic (implantable) cardiac defibrillator: Secondary | ICD-10-CM | POA: Diagnosis not present

## 2016-08-01 DIAGNOSIS — F0390 Unspecified dementia without behavioral disturbance: Secondary | ICD-10-CM | POA: Diagnosis not present

## 2016-08-01 DIAGNOSIS — E7800 Pure hypercholesterolemia, unspecified: Secondary | ICD-10-CM | POA: Diagnosis not present

## 2016-08-01 DIAGNOSIS — N400 Enlarged prostate without lower urinary tract symptoms: Secondary | ICD-10-CM | POA: Diagnosis not present

## 2016-08-01 DIAGNOSIS — R05 Cough: Secondary | ICD-10-CM | POA: Diagnosis not present

## 2016-08-01 DIAGNOSIS — R109 Unspecified abdominal pain: Secondary | ICD-10-CM | POA: Diagnosis present

## 2016-08-01 DIAGNOSIS — I517 Cardiomegaly: Secondary | ICD-10-CM | POA: Diagnosis not present

## 2016-08-01 LAB — CBC WITH DIFFERENTIAL/PLATELET
BASOS ABS: 0 10*3/uL (ref 0.0–0.1)
Basophils Relative: 0 %
Eosinophils Absolute: 0 10*3/uL (ref 0.0–0.7)
Eosinophils Relative: 0 %
HEMATOCRIT: 32.4 % — AB (ref 39.0–52.0)
Hemoglobin: 10.1 g/dL — ABNORMAL LOW (ref 13.0–17.0)
LYMPHS ABS: 0.2 10*3/uL — AB (ref 0.7–4.0)
LYMPHS PCT: 5 %
MCH: 24.6 pg — AB (ref 26.0–34.0)
MCHC: 31.2 g/dL (ref 30.0–36.0)
MCV: 78.8 fL (ref 78.0–100.0)
MONO ABS: 0.4 10*3/uL (ref 0.1–1.0)
Monocytes Relative: 8 %
NEUTROS ABS: 4.2 10*3/uL (ref 1.7–7.7)
Neutrophils Relative %: 87 %
Platelets: 144 10*3/uL — ABNORMAL LOW (ref 150–400)
RBC: 4.11 MIL/uL — AB (ref 4.22–5.81)
RDW: 21 % — AB (ref 11.5–15.5)
WBC: 4.9 10*3/uL (ref 4.0–10.5)

## 2016-08-01 LAB — URINALYSIS, ROUTINE W REFLEX MICROSCOPIC
BILIRUBIN URINE: NEGATIVE
Glucose, UA: NEGATIVE mg/dL
Ketones, ur: 5 mg/dL — AB
Nitrite: NEGATIVE
Protein, ur: 100 mg/dL — AB
pH: 5 (ref 5.0–8.0)

## 2016-08-01 LAB — COMPREHENSIVE METABOLIC PANEL
ALT: 15 U/L — AB (ref 17–63)
AST: 30 U/L (ref 15–41)
Albumin: 3.8 g/dL (ref 3.5–5.0)
Alkaline Phosphatase: 42 U/L (ref 38–126)
Anion gap: 10 (ref 5–15)
BILIRUBIN TOTAL: 0.8 mg/dL (ref 0.3–1.2)
BUN: 18 mg/dL (ref 6–20)
CO2: 22 mmol/L (ref 22–32)
CREATININE: 1.12 mg/dL (ref 0.61–1.24)
Calcium: 8.7 mg/dL — ABNORMAL LOW (ref 8.9–10.3)
Chloride: 109 mmol/L (ref 101–111)
GFR calc Af Amer: >60 mL/min (ref >60)
Glucose, Bld: 114 mg/dL — ABNORMAL HIGH (ref 65–99)
Potassium: 3.3 mmol/L — ABNORMAL LOW (ref 3.5–5.1)
Sodium: 141 mmol/L (ref 135–145)
TOTAL PROTEIN: 7 g/dL (ref 6.5–8.1)

## 2016-08-01 LAB — CBG MONITORING, ED: GLUCOSE-CAPILLARY: 108 mg/dL — AB (ref 65–99)

## 2016-08-01 NOTE — ED Provider Notes (Signed)
[facility]-EMERGENCY DEPT Provider Note   CSN: [id number] Arrival date & time: 08/01/16  1918   By signing my name below, I, Clarisse Gouge, attest that this documentation has been prepared under the direction and in the presence of Vanetta Mulders, MD. Electronically signed, Clarisse Gouge, ED Scribe. 08/01/16. 9:15 PM.   History   Chief Complaint Chief Complaint  Patient presents with  . Abdominal Pain  . Shortness of Breath   LEVEL 5 CAVEAT DUE TO ACUITY OF CONDITION  The history is provided by medical records and a relative. The history is limited by the condition of the patient. No language interpreter was used.    HPI Comments: Bradley Daugherty is a 71 y.o. male BIB EMS who presents to the Emergency Department complaining of off and on SOB and right sided abdominal pain x > 1 week. Pt coming from [facility] group home, and staff from the home expresses concern for worsening fever, disorientation, appetite loss and difficulty walking x > 3 days. Foley Catheter placed ~1 month ago for urinary issues, and family and group home note the pt has . Family notes pt seen at [facility] 3 x in the last 3 days. Pt has reportedly had a CAT scan today and ~1-2 weeks ago. Family reportedly told that the pt has gall stones today after CAT scan, though they do not believe that this is causing his pain.  Past Medical History:  Diagnosis Date  . CAD (coronary artery disease) 2002   Nonobstructive  . Chronic atrial fibrillation (HCC)    Chronic anticoagulation therapy, followed at the [facility] clinic in [place], [place]  . Chronic systolic heart failure (HCC)   . Diabetes mellitus, insulin dependent (IDDM), controlled (HCC)   . Hypercholesterolemia   . Lower GI bleed    negative colonoscopy  . Moderate mitral regurgitation   . Nonischemic cardiomyopathy (HCC)    Presumed. Ejection fraction 30-35% August 2013  . Pacemaker -Medtronic   . Pulmonary hypertension     Patient Active Problem List   Diagnosis Date Noted  . Pacemaker Medtronic 02/25/2012  . Nonischemic cardiomyopathy (HCC)   . Chronic systolic heart failure (HCC)   . HYPERTENSION 02/25/2010  . DM 01/29/2010  . DYSLIPIDEMIA 01/29/2010  . CAD 01/29/2010  . ATRIAL FIBRILLATION 01/29/2010  . BRADYCARDIA 01/29/2010    Past Surgical History:  Procedure Laterality Date  . COLONOSCOPY    . HERNIA REPAIR    . PACEMAKER PLACEMENT         Home Medications    Prior to Admission medications   Medication Sig Start Date End Date Taking? Authorizing Provider  ALPRAZolam Prudy Feeler) 0.5 MG tablet Take 0.5 mg by mouth as needed.   Yes Historical Provider, MD  atorvastatin (LIPITOR) 20 MG tablet Take 20 mg by mouth at bedtime.   Yes Historical Provider, MD  cetirizine (ZYRTEC) 10 MG tablet Take 10 mg by mouth daily.   Yes Historical Provider, MD  ciprofloxacin (CIPRO) 500 MG tablet Take 500 mg by mouth 2 (two) times daily.   Yes Historical Provider, MD  donepezil (ARICEPT) 10 MG tablet Take 10 mg by mouth daily.   Yes Historical Provider, MD  furosemide (LASIX) 80 MG tablet Take 20 mg by mouth 2 (two) times daily.    Yes Historical Provider, MD  glipiZIDE (GLUCOTROL) 10 MG tablet Take 10 mg by mouth 2 (two) times daily before a meal.   Yes Historical Provider, MD  insulin glargine (LANTUS) 100 UNIT/ML injection Inject 20  Units into the skin at bedtime.    Yes Historical Provider, MD  isosorbide mononitrate (IMDUR) 30 MG 24 hr tablet Take 30 mg by mouth daily.   Yes Historical Provider, MD  lisinopril (PRINIVIL,ZESTRIL) 20 MG tablet Take 10 mg by mouth daily.    Yes Historical Provider, MD  metFORMIN (GLUCOPHAGE) 1000 MG tablet Take 500 mg by mouth 2 (two) times daily with a meal.    Yes Historical Provider, MD  metroNIDAZOLE (FLAGYL) 500 MG tablet Take 500 mg by mouth 3 (three) times daily.   Yes Historical Provider, MD  Omega-3 Fatty Acids (FISH OIL) 1000 MG CAPS Take 1 capsule by mouth daily.   Yes Historical Provider, MD    omeprazole (PRILOSEC) 20 MG capsule Take 20 mg by mouth daily.   Yes Historical Provider, MD  potassium chloride SA (K-DUR,KLOR-CON) 20 MEQ tablet Take 20 mEq by mouth daily.   Yes Historical Provider, MD  rivaroxaban (XARELTO) 20 MG TABS tablet Take 20 mg by mouth daily.   Yes Historical Provider, MD  sertraline (ZOLOFT) 100 MG tablet Take 100 mg by mouth daily.   Yes Historical Provider, MD  Tamsulosin HCl (FLOMAX) 0.4 MG CAPS Take 0.4 mg by mouth 2 (two) times daily.    Yes Historical Provider, MD  traZODone (DESYREL) 150 MG tablet Take 150 mg by mouth at bedtime.    Historical Provider, MD  warfarin (COUMADIN) 5 MG tablet Take 5 mg by mouth as directed.    Historical Provider, MD    Family History No family history on file.  Social History Social History  Substance Use Topics  . Smoking status: Former Smoker    Packs/day: 1.00    Years: 17.00    Types: Cigarettes    Quit date: 06/21/1994  . Smokeless tobacco: Never Used  . Alcohol use No     Allergies   Gabapentin and Simvastatin   Review of Systems Review of Systems  Unable to perform ROS: Acuity of condition     Physical Exam Updated Vital Signs BP 133/72 (BP Location: Right Arm)   Pulse 92   Temp 100.8 F (38.2 C) (Rectal)   Resp (!) 35   Ht 5\' 8"  (1.727 m)   Wt 165 lb (74.8 kg)   SpO2 100%   BMI 25.09 kg/m   Physical Exam  Constitutional: He is oriented to person, place, and time. Vital signs are normal. He appears well-developed and well-nourished.  Non-toxic appearance. No distress.  Low grade fever noted  HENT:  Head: Normocephalic and atraumatic.  Mouth/Throat: Uvula is midline, oropharynx is clear and moist and mucous membranes are normal.  Eyes: Conjunctivae and EOM are normal. Pupils are equal, round, and reactive to light. Right eye exhibits no discharge. Left eye exhibits no discharge. No scleral icterus.  Neck: Normal range of motion. Neck supple.  Cardiovascular: Normal rate, regular rhythm,  normal heart sounds and intact distal pulses.   No lower extremity swelling; Left upper chest implanted pacemaker.  Pulmonary/Chest: Effort normal and breath sounds normal. No respiratory distress. He has no wheezes. He has no rales. He exhibits no tenderness.  Abdominal: Soft. Normal appearance and bowel sounds are normal. He exhibits no distension. There is generalized tenderness (diffuse). There is no guarding.  Musculoskeletal: Normal range of motion.  Neurological: He is alert and oriented to person, place, and time. He has normal strength. No cranial nerve deficit or sensory deficit. He exhibits normal muscle tone. Coordination normal.  Skin: Skin is warm, dry  and intact. No rash noted.  Psychiatric: He has a normal mood and affect.  Nursing note and vitals reviewed.    ED Treatments / Results  DIAGNOSTIC STUDIES: Oxygen Saturation is 100% on RA, normal by my interpretation.    COORDINATION OF CARE: 9:12 PM Discussed treatment plan with family at bedside and she agreed to plan. Will order imaging and blood work, then reassess.  Labs (all labs ordered are listed, but only abnormal results are displayed) Labs Reviewed  CBC WITH DIFFERENTIAL/PLATELET - Abnormal; Notable for the following:       Result Value   RBC 4.11 (*)    Hemoglobin 10.1 (*)    HCT 32.4 (*)    MCH 24.6 (*)    RDW 21.0 (*)    Platelets 144 (*)    Lymphs Abs 0.2 (*)    All other components within normal limits  COMPREHENSIVE METABOLIC PANEL - Abnormal; Notable for the following:    Potassium 3.3 (*)    Glucose, Bld 114 (*)    Calcium 8.7 (*)    ALT 15 (*)    All other components within normal limits  URINALYSIS, ROUTINE W REFLEX MICROSCOPIC - Abnormal; Notable for the following:    APPearance HAZY (*)    Specific Gravity, Urine >1.046 (*)    Hgb urine dipstick MODERATE (*)    Ketones, ur 5 (*)    Protein, ur 100 (*)    Leukocytes, UA TRACE (*)    Bacteria, UA RARE (*)    All other components within  normal limits  CBG MONITORING, ED - Abnormal; Notable for the following:    Glucose-Capillary 108 (*)    All other components within normal limits  URINE CULTURE    EKG  EKG Interpretation None       Radiology Dg Chest Portable 1 View  Result Date: 08/01/2016 CLINICAL DATA:  Seen at outside hospital 3 days ago for shortness of breath and diarrhea. History of pulmonary hypertension, diabetes, heart failure. EXAM: PORTABLE CHEST 1 VIEW COMPARISON:  Chest radiograph from [facility] August 01, 2016 at 1108 hours FINDINGS: Cardiac silhouette is moderately enlarged unchanged. Similar pulmonary vascular congestion without pleural effusion or focal consolidation. No pneumothorax. Dual lead LEFT cardiac pacemaker in situ. Soft tissue planes and included osseous structures are nonsuspicious. IMPRESSION: Stable examination:  Cardiomegaly and pulmonary vascular congestion. Electronically Signed   By: Awilda Metro M.D.   On: 08/01/2016 19:56    Procedures Procedures (including critical care time)  Medications Ordered in ED Medications - No data to display   Initial Impression / Assessment and Plan / ED Course  I have reviewed the triage vital signs and the nursing notes.  Pertinent labs & imaging results that were available during my care of the patient were reviewed by me and considered in my medical decision making (see chart for details).     Patient with multiple evaluations for same complaint at [facility]. Patient seen earlier today got the reports. CT scan chest x-ray negative except showing cholelithiasis. No evidence of: Cystitis. Labs without any sniffing abnormalities. Chest x-ray negative for pneumonia urinalysis negative. Repeat labs here basically confirm what they show. CT scan was not done. There is no tenderness in the right upper quadrant. No evidence urinary tract infection chest x-ray negative for pneumonia. Patient stable for discharge back to group home.  Recommend follow-up with urology to see if Foley catheter can be removed. And [facility] made referral to general surgery for further evaluation of  the gallstones recommend they keep that appointment.    I personally performed the services described in this documentation, which was scribed in my presence. The recorded information has been reviewed and is accurate.     Final Clinical Impressions(s) / ED Diagnoses   Final diagnoses:  Shortness of breath  Calculus of gallbladder without cholecystitis without obstruction    New Prescriptions New Prescriptions   No medications on file     Vanetta Mulders, MD 08/01/16 2339

## 2016-08-01 NOTE — ED Triage Notes (Signed)
Pt seen at [facility] last 3 nights for clear diarrhea (per EMS) and SOB.  Family requesting pt to be evaluated here.  They feel patient has not received appropriate care

## 2016-08-01 NOTE — Discharge Instructions (Signed)
Workup here without any acute findings. CT scan done at [facility] this morning did show gallstones but it does not appear that the gallstones are related to his pain. No signs of pneumonia chest x-ray negative. Recommend follow-up with urology determine whether he still needs to have the Foley catheter. Recommend follow-up with surgery. [facility] made a referral to Dr. Eula Listen. Would recommend keeping the appointment. No indication for admission.

## 2016-08-01 NOTE — ED Notes (Signed)
Pt found with hardened stool in diaper and foley catheter coming from the top of pt's diaper. Stool noted on foley catheter. Pt cleaned and catheter care provided. Skin breakdown noted on scrotum and buttock.

## 2016-08-02 DIAGNOSIS — Z743 Need for continuous supervision: Secondary | ICD-10-CM | POA: Diagnosis not present

## 2016-08-02 DIAGNOSIS — R279 Unspecified lack of coordination: Secondary | ICD-10-CM | POA: Diagnosis not present

## 2016-08-02 DIAGNOSIS — Z7401 Bed confinement status: Secondary | ICD-10-CM | POA: Diagnosis not present

## 2016-08-03 DIAGNOSIS — I482 Chronic atrial fibrillation: Secondary | ICD-10-CM | POA: Diagnosis not present

## 2016-08-03 DIAGNOSIS — R2689 Other abnormalities of gait and mobility: Secondary | ICD-10-CM | POA: Diagnosis not present

## 2016-08-03 DIAGNOSIS — E1165 Type 2 diabetes mellitus with hyperglycemia: Secondary | ICD-10-CM | POA: Diagnosis not present

## 2016-08-03 DIAGNOSIS — F338 Other recurrent depressive disorders: Secondary | ICD-10-CM | POA: Diagnosis not present

## 2016-08-03 DIAGNOSIS — Z87440 Personal history of urinary (tract) infections: Secondary | ICD-10-CM | POA: Diagnosis not present

## 2016-08-03 DIAGNOSIS — Z466 Encounter for fitting and adjustment of urinary device: Secondary | ICD-10-CM | POA: Diagnosis not present

## 2016-08-03 LAB — URINE CULTURE

## 2016-08-04 DIAGNOSIS — Z87440 Personal history of urinary (tract) infections: Secondary | ICD-10-CM | POA: Diagnosis not present

## 2016-08-04 DIAGNOSIS — E1165 Type 2 diabetes mellitus with hyperglycemia: Secondary | ICD-10-CM | POA: Diagnosis not present

## 2016-08-04 DIAGNOSIS — Z466 Encounter for fitting and adjustment of urinary device: Secondary | ICD-10-CM | POA: Diagnosis not present

## 2016-08-04 DIAGNOSIS — F338 Other recurrent depressive disorders: Secondary | ICD-10-CM | POA: Diagnosis not present

## 2016-08-04 DIAGNOSIS — I482 Chronic atrial fibrillation: Secondary | ICD-10-CM | POA: Diagnosis not present

## 2016-08-04 DIAGNOSIS — R2689 Other abnormalities of gait and mobility: Secondary | ICD-10-CM | POA: Diagnosis not present

## 2016-08-05 DIAGNOSIS — J4530 Mild persistent asthma, uncomplicated: Secondary | ICD-10-CM | POA: Diagnosis not present

## 2016-08-05 DIAGNOSIS — N4289 Other specified disorders of prostate: Secondary | ICD-10-CM | POA: Diagnosis not present

## 2016-08-05 DIAGNOSIS — E1165 Type 2 diabetes mellitus with hyperglycemia: Secondary | ICD-10-CM | POA: Diagnosis not present

## 2016-08-09 DIAGNOSIS — R2689 Other abnormalities of gait and mobility: Secondary | ICD-10-CM | POA: Diagnosis not present

## 2016-08-09 DIAGNOSIS — F338 Other recurrent depressive disorders: Secondary | ICD-10-CM | POA: Diagnosis not present

## 2016-08-09 DIAGNOSIS — E1165 Type 2 diabetes mellitus with hyperglycemia: Secondary | ICD-10-CM | POA: Diagnosis not present

## 2016-08-09 DIAGNOSIS — Z466 Encounter for fitting and adjustment of urinary device: Secondary | ICD-10-CM | POA: Diagnosis not present

## 2016-08-09 DIAGNOSIS — I482 Chronic atrial fibrillation: Secondary | ICD-10-CM | POA: Diagnosis not present

## 2016-08-09 DIAGNOSIS — Z87440 Personal history of urinary (tract) infections: Secondary | ICD-10-CM | POA: Diagnosis not present

## 2016-08-13 DIAGNOSIS — I5022 Chronic systolic (congestive) heart failure: Secondary | ICD-10-CM | POA: Diagnosis not present

## 2016-08-13 DIAGNOSIS — T83498A Other mechanical complication of other prosthetic devices, implants and grafts of genital tract, initial encounter: Secondary | ICD-10-CM | POA: Diagnosis not present

## 2016-08-13 DIAGNOSIS — I482 Chronic atrial fibrillation: Secondary | ICD-10-CM | POA: Diagnosis not present

## 2016-08-13 DIAGNOSIS — R58 Hemorrhage, not elsewhere classified: Secondary | ICD-10-CM | POA: Diagnosis not present

## 2016-08-13 DIAGNOSIS — T8383XA Hemorrhage of genitourinary prosthetic devices, implants and grafts, initial encounter: Secondary | ICD-10-CM | POA: Diagnosis not present

## 2016-08-13 DIAGNOSIS — R2689 Other abnormalities of gait and mobility: Secondary | ICD-10-CM | POA: Diagnosis not present

## 2016-08-13 DIAGNOSIS — D62 Acute posthemorrhagic anemia: Secondary | ICD-10-CM | POA: Diagnosis not present

## 2016-08-13 DIAGNOSIS — Z466 Encounter for fitting and adjustment of urinary device: Secondary | ICD-10-CM | POA: Diagnosis not present

## 2016-08-13 DIAGNOSIS — R310 Gross hematuria: Secondary | ICD-10-CM | POA: Diagnosis not present

## 2016-08-13 DIAGNOSIS — F338 Other recurrent depressive disorders: Secondary | ICD-10-CM | POA: Diagnosis not present

## 2016-08-13 DIAGNOSIS — E1165 Type 2 diabetes mellitus with hyperglycemia: Secondary | ICD-10-CM | POA: Diagnosis not present

## 2016-08-13 DIAGNOSIS — T83511A Infection and inflammatory reaction due to indwelling urethral catheter, initial encounter: Secondary | ICD-10-CM | POA: Diagnosis not present

## 2016-08-13 DIAGNOSIS — T82898A Other specified complication of vascular prosthetic devices, implants and grafts, initial encounter: Secondary | ICD-10-CM | POA: Diagnosis not present

## 2016-08-13 DIAGNOSIS — T83091A Other mechanical complication of indwelling urethral catheter, initial encounter: Secondary | ICD-10-CM | POA: Diagnosis not present

## 2016-08-13 DIAGNOSIS — Z87440 Personal history of urinary (tract) infections: Secondary | ICD-10-CM | POA: Diagnosis not present

## 2016-08-13 DIAGNOSIS — N390 Urinary tract infection, site not specified: Secondary | ICD-10-CM | POA: Diagnosis not present

## 2016-08-13 DIAGNOSIS — R3121 Asymptomatic microscopic hematuria: Secondary | ICD-10-CM | POA: Diagnosis not present

## 2016-08-14 DIAGNOSIS — I69351 Hemiplegia and hemiparesis following cerebral infarction affecting right dominant side: Secondary | ICD-10-CM | POA: Diagnosis not present

## 2016-08-14 DIAGNOSIS — E785 Hyperlipidemia, unspecified: Secondary | ICD-10-CM | POA: Diagnosis present

## 2016-08-14 DIAGNOSIS — M6281 Muscle weakness (generalized): Secondary | ICD-10-CM | POA: Diagnosis not present

## 2016-08-14 DIAGNOSIS — B965 Pseudomonas (aeruginosa) (mallei) (pseudomallei) as the cause of diseases classified elsewhere: Secondary | ICD-10-CM | POA: Diagnosis not present

## 2016-08-14 DIAGNOSIS — Z79899 Other long term (current) drug therapy: Secondary | ICD-10-CM | POA: Diagnosis not present

## 2016-08-14 DIAGNOSIS — N400 Enlarged prostate without lower urinary tract symptoms: Secondary | ICD-10-CM | POA: Diagnosis present

## 2016-08-14 DIAGNOSIS — I482 Chronic atrial fibrillation: Secondary | ICD-10-CM | POA: Diagnosis present

## 2016-08-14 DIAGNOSIS — K219 Gastro-esophageal reflux disease without esophagitis: Secondary | ICD-10-CM | POA: Diagnosis present

## 2016-08-14 DIAGNOSIS — E119 Type 2 diabetes mellitus without complications: Secondary | ICD-10-CM | POA: Diagnosis not present

## 2016-08-14 DIAGNOSIS — I509 Heart failure, unspecified: Secondary | ICD-10-CM | POA: Diagnosis not present

## 2016-08-14 DIAGNOSIS — R2689 Other abnormalities of gait and mobility: Secondary | ICD-10-CM | POA: Diagnosis not present

## 2016-08-14 DIAGNOSIS — R278 Other lack of coordination: Secondary | ICD-10-CM | POA: Diagnosis not present

## 2016-08-14 DIAGNOSIS — Z794 Long term (current) use of insulin: Secondary | ICD-10-CM | POA: Diagnosis not present

## 2016-08-14 DIAGNOSIS — E1122 Type 2 diabetes mellitus with diabetic chronic kidney disease: Secondary | ICD-10-CM | POA: Diagnosis present

## 2016-08-14 DIAGNOSIS — T83511A Infection and inflammatory reaction due to indwelling urethral catheter, initial encounter: Secondary | ICD-10-CM | POA: Diagnosis not present

## 2016-08-14 DIAGNOSIS — E039 Hypothyroidism, unspecified: Secondary | ICD-10-CM | POA: Diagnosis present

## 2016-08-14 DIAGNOSIS — K8020 Calculus of gallbladder without cholecystitis without obstruction: Secondary | ICD-10-CM | POA: Diagnosis present

## 2016-08-14 DIAGNOSIS — D62 Acute posthemorrhagic anemia: Secondary | ICD-10-CM | POA: Diagnosis not present

## 2016-08-14 DIAGNOSIS — N390 Urinary tract infection, site not specified: Secondary | ICD-10-CM | POA: Diagnosis not present

## 2016-08-14 DIAGNOSIS — J309 Allergic rhinitis, unspecified: Secondary | ICD-10-CM | POA: Diagnosis present

## 2016-08-14 DIAGNOSIS — B964 Proteus (mirabilis) (morganii) as the cause of diseases classified elsewhere: Secondary | ICD-10-CM | POA: Diagnosis present

## 2016-08-14 DIAGNOSIS — Z87891 Personal history of nicotine dependence: Secondary | ICD-10-CM | POA: Diagnosis not present

## 2016-08-14 DIAGNOSIS — T8383XA Hemorrhage of genitourinary prosthetic devices, implants and grafts, initial encounter: Secondary | ICD-10-CM | POA: Diagnosis present

## 2016-08-14 DIAGNOSIS — R339 Retention of urine, unspecified: Secondary | ICD-10-CM | POA: Diagnosis present

## 2016-08-14 DIAGNOSIS — F329 Major depressive disorder, single episode, unspecified: Secondary | ICD-10-CM | POA: Diagnosis present

## 2016-08-14 DIAGNOSIS — I5022 Chronic systolic (congestive) heart failure: Secondary | ICD-10-CM | POA: Diagnosis not present

## 2016-08-14 DIAGNOSIS — F0390 Unspecified dementia without behavioral disturbance: Secondary | ICD-10-CM | POA: Diagnosis present

## 2016-08-14 DIAGNOSIS — Z888 Allergy status to other drugs, medicaments and biological substances status: Secondary | ICD-10-CM | POA: Diagnosis not present

## 2016-08-14 DIAGNOSIS — N183 Chronic kidney disease, stage 3 (moderate): Secondary | ICD-10-CM | POA: Diagnosis not present

## 2016-08-14 DIAGNOSIS — I110 Hypertensive heart disease with heart failure: Secondary | ICD-10-CM | POA: Diagnosis present

## 2016-08-14 DIAGNOSIS — Z7901 Long term (current) use of anticoagulants: Secondary | ICD-10-CM | POA: Diagnosis not present

## 2016-08-14 DIAGNOSIS — Z91030 Bee allergy status: Secondary | ICD-10-CM | POA: Diagnosis not present

## 2016-08-14 DIAGNOSIS — R310 Gross hematuria: Secondary | ICD-10-CM | POA: Diagnosis present

## 2016-08-19 DIAGNOSIS — F329 Major depressive disorder, single episode, unspecified: Secondary | ICD-10-CM | POA: Diagnosis present

## 2016-08-19 DIAGNOSIS — R29810 Facial weakness: Secondary | ICD-10-CM | POA: Diagnosis not present

## 2016-08-19 DIAGNOSIS — B965 Pseudomonas (aeruginosa) (mallei) (pseudomallei) as the cause of diseases classified elsewhere: Secondary | ICD-10-CM | POA: Diagnosis not present

## 2016-08-19 DIAGNOSIS — I638 Other cerebral infarction: Secondary | ICD-10-CM | POA: Diagnosis not present

## 2016-08-19 DIAGNOSIS — K8020 Calculus of gallbladder without cholecystitis without obstruction: Secondary | ICD-10-CM | POA: Diagnosis present

## 2016-08-19 DIAGNOSIS — T83511A Infection and inflammatory reaction due to indwelling urethral catheter, initial encounter: Secondary | ICD-10-CM | POA: Diagnosis not present

## 2016-08-19 DIAGNOSIS — I69351 Hemiplegia and hemiparesis following cerebral infarction affecting right dominant side: Secondary | ICD-10-CM | POA: Diagnosis not present

## 2016-08-19 DIAGNOSIS — N183 Chronic kidney disease, stage 3 (moderate): Secondary | ICD-10-CM | POA: Diagnosis not present

## 2016-08-19 DIAGNOSIS — J0100 Acute maxillary sinusitis, unspecified: Secondary | ICD-10-CM | POA: Diagnosis not present

## 2016-08-19 DIAGNOSIS — R1319 Other dysphagia: Secondary | ICD-10-CM | POA: Diagnosis not present

## 2016-08-19 DIAGNOSIS — Z888 Allergy status to other drugs, medicaments and biological substances status: Secondary | ICD-10-CM | POA: Diagnosis not present

## 2016-08-19 DIAGNOSIS — G8191 Hemiplegia, unspecified affecting right dominant side: Secondary | ICD-10-CM | POA: Diagnosis present

## 2016-08-19 DIAGNOSIS — J309 Allergic rhinitis, unspecified: Secondary | ICD-10-CM | POA: Diagnosis present

## 2016-08-19 DIAGNOSIS — I6789 Other cerebrovascular disease: Secondary | ICD-10-CM | POA: Diagnosis not present

## 2016-08-19 DIAGNOSIS — M6281 Muscle weakness (generalized): Secondary | ICD-10-CM | POA: Diagnosis not present

## 2016-08-19 DIAGNOSIS — D62 Acute posthemorrhagic anemia: Secondary | ICD-10-CM | POA: Diagnosis not present

## 2016-08-19 DIAGNOSIS — R531 Weakness: Secondary | ICD-10-CM | POA: Diagnosis not present

## 2016-08-19 DIAGNOSIS — Z794 Long term (current) use of insulin: Secondary | ICD-10-CM | POA: Diagnosis not present

## 2016-08-19 DIAGNOSIS — Z87891 Personal history of nicotine dependence: Secondary | ICD-10-CM | POA: Diagnosis not present

## 2016-08-19 DIAGNOSIS — E1165 Type 2 diabetes mellitus with hyperglycemia: Secondary | ICD-10-CM | POA: Diagnosis present

## 2016-08-19 DIAGNOSIS — I5022 Chronic systolic (congestive) heart failure: Secondary | ICD-10-CM | POA: Diagnosis not present

## 2016-08-19 DIAGNOSIS — I482 Chronic atrial fibrillation: Secondary | ICD-10-CM | POA: Diagnosis present

## 2016-08-19 DIAGNOSIS — Z79899 Other long term (current) drug therapy: Secondary | ICD-10-CM | POA: Diagnosis not present

## 2016-08-19 DIAGNOSIS — I110 Hypertensive heart disease with heart failure: Secondary | ICD-10-CM | POA: Diagnosis present

## 2016-08-19 DIAGNOSIS — I639 Cerebral infarction, unspecified: Secondary | ICD-10-CM | POA: Diagnosis not present

## 2016-08-19 DIAGNOSIS — R339 Retention of urine, unspecified: Secondary | ICD-10-CM | POA: Diagnosis present

## 2016-08-19 DIAGNOSIS — R278 Other lack of coordination: Secondary | ICD-10-CM | POA: Diagnosis not present

## 2016-08-19 DIAGNOSIS — R1310 Dysphagia, unspecified: Secondary | ICD-10-CM | POA: Diagnosis present

## 2016-08-19 DIAGNOSIS — I509 Heart failure, unspecified: Secondary | ICD-10-CM | POA: Diagnosis not present

## 2016-08-19 DIAGNOSIS — R0989 Other specified symptoms and signs involving the circulatory and respiratory systems: Secondary | ICD-10-CM | POA: Diagnosis not present

## 2016-08-19 DIAGNOSIS — K219 Gastro-esophageal reflux disease without esophagitis: Secondary | ICD-10-CM | POA: Diagnosis present

## 2016-08-19 DIAGNOSIS — F0390 Unspecified dementia without behavioral disturbance: Secondary | ICD-10-CM | POA: Diagnosis present

## 2016-08-19 DIAGNOSIS — R319 Hematuria, unspecified: Secondary | ICD-10-CM | POA: Diagnosis not present

## 2016-08-19 DIAGNOSIS — E119 Type 2 diabetes mellitus without complications: Secondary | ICD-10-CM | POA: Diagnosis not present

## 2016-08-19 DIAGNOSIS — E039 Hypothyroidism, unspecified: Secondary | ICD-10-CM | POA: Diagnosis present

## 2016-08-19 DIAGNOSIS — N400 Enlarged prostate without lower urinary tract symptoms: Secondary | ICD-10-CM | POA: Diagnosis present

## 2016-08-19 DIAGNOSIS — Z7982 Long term (current) use of aspirin: Secondary | ICD-10-CM | POA: Diagnosis not present

## 2016-08-19 DIAGNOSIS — G8111 Spastic hemiplegia affecting right dominant side: Secondary | ICD-10-CM | POA: Diagnosis not present

## 2016-08-19 DIAGNOSIS — Z950 Presence of cardiac pacemaker: Secondary | ICD-10-CM | POA: Diagnosis not present

## 2016-08-19 DIAGNOSIS — N390 Urinary tract infection, site not specified: Secondary | ICD-10-CM | POA: Diagnosis not present

## 2016-08-19 DIAGNOSIS — B952 Enterococcus as the cause of diseases classified elsewhere: Secondary | ICD-10-CM | POA: Diagnosis present

## 2016-08-19 DIAGNOSIS — E785 Hyperlipidemia, unspecified: Secondary | ICD-10-CM | POA: Diagnosis present

## 2016-08-19 DIAGNOSIS — R2689 Other abnormalities of gait and mobility: Secondary | ICD-10-CM | POA: Diagnosis not present

## 2016-08-19 DIAGNOSIS — J9811 Atelectasis: Secondary | ICD-10-CM | POA: Diagnosis not present

## 2016-08-25 DIAGNOSIS — E119 Type 2 diabetes mellitus without complications: Secondary | ICD-10-CM | POA: Diagnosis not present

## 2016-08-25 DIAGNOSIS — F329 Major depressive disorder, single episode, unspecified: Secondary | ICD-10-CM | POA: Diagnosis present

## 2016-08-25 DIAGNOSIS — N400 Enlarged prostate without lower urinary tract symptoms: Secondary | ICD-10-CM | POA: Diagnosis present

## 2016-08-25 DIAGNOSIS — R0989 Other specified symptoms and signs involving the circulatory and respiratory systems: Secondary | ICD-10-CM | POA: Diagnosis not present

## 2016-08-25 DIAGNOSIS — I69351 Hemiplegia and hemiparesis following cerebral infarction affecting right dominant side: Secondary | ICD-10-CM | POA: Diagnosis not present

## 2016-08-25 DIAGNOSIS — Z888 Allergy status to other drugs, medicaments and biological substances status: Secondary | ICD-10-CM | POA: Diagnosis not present

## 2016-08-25 DIAGNOSIS — K8020 Calculus of gallbladder without cholecystitis without obstruction: Secondary | ICD-10-CM | POA: Diagnosis present

## 2016-08-25 DIAGNOSIS — I509 Heart failure, unspecified: Secondary | ICD-10-CM | POA: Diagnosis not present

## 2016-08-25 DIAGNOSIS — I69328 Other speech and language deficits following cerebral infarction: Secondary | ICD-10-CM | POA: Diagnosis not present

## 2016-08-25 DIAGNOSIS — R278 Other lack of coordination: Secondary | ICD-10-CM | POA: Diagnosis not present

## 2016-08-25 DIAGNOSIS — B965 Pseudomonas (aeruginosa) (mallei) (pseudomallei) as the cause of diseases classified elsewhere: Secondary | ICD-10-CM | POA: Diagnosis not present

## 2016-08-25 DIAGNOSIS — E785 Hyperlipidemia, unspecified: Secondary | ICD-10-CM | POA: Diagnosis present

## 2016-08-25 DIAGNOSIS — I482 Chronic atrial fibrillation: Secondary | ICD-10-CM | POA: Diagnosis present

## 2016-08-25 DIAGNOSIS — J9811 Atelectasis: Secondary | ICD-10-CM | POA: Diagnosis not present

## 2016-08-25 DIAGNOSIS — Z794 Long term (current) use of insulin: Secondary | ICD-10-CM | POA: Diagnosis not present

## 2016-08-25 DIAGNOSIS — R1312 Dysphagia, oropharyngeal phase: Secondary | ICD-10-CM | POA: Diagnosis not present

## 2016-08-25 DIAGNOSIS — B952 Enterococcus as the cause of diseases classified elsewhere: Secondary | ICD-10-CM | POA: Diagnosis present

## 2016-08-25 DIAGNOSIS — R2689 Other abnormalities of gait and mobility: Secondary | ICD-10-CM | POA: Diagnosis not present

## 2016-08-25 DIAGNOSIS — K219 Gastro-esophageal reflux disease without esophagitis: Secondary | ICD-10-CM | POA: Diagnosis present

## 2016-08-25 DIAGNOSIS — I639 Cerebral infarction, unspecified: Secondary | ICD-10-CM | POA: Diagnosis not present

## 2016-08-25 DIAGNOSIS — Z87891 Personal history of nicotine dependence: Secondary | ICD-10-CM | POA: Diagnosis not present

## 2016-08-25 DIAGNOSIS — G8191 Hemiplegia, unspecified affecting right dominant side: Secondary | ICD-10-CM | POA: Diagnosis not present

## 2016-08-25 DIAGNOSIS — M6281 Muscle weakness (generalized): Secondary | ICD-10-CM | POA: Diagnosis not present

## 2016-08-25 DIAGNOSIS — R279 Unspecified lack of coordination: Secondary | ICD-10-CM | POA: Diagnosis not present

## 2016-08-25 DIAGNOSIS — J0100 Acute maxillary sinusitis, unspecified: Secondary | ICD-10-CM | POA: Diagnosis not present

## 2016-08-25 DIAGNOSIS — I5022 Chronic systolic (congestive) heart failure: Secondary | ICD-10-CM | POA: Diagnosis not present

## 2016-08-25 DIAGNOSIS — I638 Other cerebral infarction: Secondary | ICD-10-CM | POA: Diagnosis not present

## 2016-08-25 DIAGNOSIS — R1310 Dysphagia, unspecified: Secondary | ICD-10-CM | POA: Diagnosis not present

## 2016-08-25 DIAGNOSIS — R319 Hematuria, unspecified: Secondary | ICD-10-CM | POA: Diagnosis not present

## 2016-08-25 DIAGNOSIS — G8111 Spastic hemiplegia affecting right dominant side: Secondary | ICD-10-CM | POA: Diagnosis not present

## 2016-08-25 DIAGNOSIS — Z950 Presence of cardiac pacemaker: Secondary | ICD-10-CM | POA: Diagnosis not present

## 2016-08-25 DIAGNOSIS — E039 Hypothyroidism, unspecified: Secondary | ICD-10-CM | POA: Diagnosis present

## 2016-08-25 DIAGNOSIS — I110 Hypertensive heart disease with heart failure: Secondary | ICD-10-CM | POA: Diagnosis present

## 2016-08-25 DIAGNOSIS — R531 Weakness: Secondary | ICD-10-CM | POA: Diagnosis not present

## 2016-08-25 DIAGNOSIS — Z7401 Bed confinement status: Secondary | ICD-10-CM | POA: Diagnosis not present

## 2016-08-25 DIAGNOSIS — E1165 Type 2 diabetes mellitus with hyperglycemia: Secondary | ICD-10-CM | POA: Diagnosis present

## 2016-08-25 DIAGNOSIS — Z7982 Long term (current) use of aspirin: Secondary | ICD-10-CM | POA: Diagnosis not present

## 2016-08-25 DIAGNOSIS — R1319 Other dysphagia: Secondary | ICD-10-CM | POA: Diagnosis not present

## 2016-08-25 DIAGNOSIS — R339 Retention of urine, unspecified: Secondary | ICD-10-CM | POA: Diagnosis present

## 2016-08-25 DIAGNOSIS — R29810 Facial weakness: Secondary | ICD-10-CM | POA: Diagnosis not present

## 2016-08-25 DIAGNOSIS — Z79899 Other long term (current) drug therapy: Secondary | ICD-10-CM | POA: Diagnosis not present

## 2016-08-25 DIAGNOSIS — F0390 Unspecified dementia without behavioral disturbance: Secondary | ICD-10-CM | POA: Diagnosis present

## 2016-08-25 DIAGNOSIS — R41841 Cognitive communication deficit: Secondary | ICD-10-CM | POA: Diagnosis not present

## 2016-08-25 DIAGNOSIS — N390 Urinary tract infection, site not specified: Secondary | ICD-10-CM | POA: Diagnosis not present

## 2016-08-25 DIAGNOSIS — I6789 Other cerebrovascular disease: Secondary | ICD-10-CM | POA: Diagnosis not present

## 2016-08-25 DIAGNOSIS — J309 Allergic rhinitis, unspecified: Secondary | ICD-10-CM | POA: Diagnosis present

## 2016-08-29 DIAGNOSIS — R1310 Dysphagia, unspecified: Secondary | ICD-10-CM | POA: Diagnosis not present

## 2016-08-29 DIAGNOSIS — S42201A Unspecified fracture of upper end of right humerus, initial encounter for closed fracture: Secondary | ICD-10-CM | POA: Diagnosis not present

## 2016-08-29 DIAGNOSIS — R278 Other lack of coordination: Secondary | ICD-10-CM | POA: Diagnosis not present

## 2016-08-29 DIAGNOSIS — M25511 Pain in right shoulder: Secondary | ICD-10-CM | POA: Diagnosis not present

## 2016-08-29 DIAGNOSIS — R41841 Cognitive communication deficit: Secondary | ICD-10-CM | POA: Diagnosis not present

## 2016-08-29 DIAGNOSIS — M8580 Other specified disorders of bone density and structure, unspecified site: Secondary | ICD-10-CM | POA: Diagnosis not present

## 2016-08-29 DIAGNOSIS — S4991XA Unspecified injury of right shoulder and upper arm, initial encounter: Secondary | ICD-10-CM | POA: Diagnosis not present

## 2016-08-29 DIAGNOSIS — R310 Gross hematuria: Secondary | ICD-10-CM | POA: Diagnosis not present

## 2016-08-29 DIAGNOSIS — I110 Hypertensive heart disease with heart failure: Secondary | ICD-10-CM | POA: Diagnosis not present

## 2016-08-29 DIAGNOSIS — E119 Type 2 diabetes mellitus without complications: Secondary | ICD-10-CM | POA: Diagnosis not present

## 2016-08-29 DIAGNOSIS — Z7401 Bed confinement status: Secondary | ICD-10-CM | POA: Diagnosis not present

## 2016-08-29 DIAGNOSIS — I509 Heart failure, unspecified: Secondary | ICD-10-CM | POA: Diagnosis not present

## 2016-08-29 DIAGNOSIS — N390 Urinary tract infection, site not specified: Secondary | ICD-10-CM | POA: Diagnosis not present

## 2016-08-29 DIAGNOSIS — F0390 Unspecified dementia without behavioral disturbance: Secondary | ICD-10-CM | POA: Diagnosis not present

## 2016-08-29 DIAGNOSIS — I69351 Hemiplegia and hemiparesis following cerebral infarction affecting right dominant side: Secondary | ICD-10-CM | POA: Diagnosis not present

## 2016-08-29 DIAGNOSIS — S42251A Displaced fracture of greater tuberosity of right humerus, initial encounter for closed fracture: Secondary | ICD-10-CM | POA: Diagnosis not present

## 2016-08-29 DIAGNOSIS — S42291A Other displaced fracture of upper end of right humerus, initial encounter for closed fracture: Secondary | ICD-10-CM | POA: Diagnosis not present

## 2016-08-29 DIAGNOSIS — R279 Unspecified lack of coordination: Secondary | ICD-10-CM | POA: Diagnosis not present

## 2016-08-29 DIAGNOSIS — R1319 Other dysphagia: Secondary | ICD-10-CM | POA: Diagnosis not present

## 2016-08-29 DIAGNOSIS — I638 Other cerebral infarction: Secondary | ICD-10-CM | POA: Diagnosis not present

## 2016-08-29 DIAGNOSIS — I5022 Chronic systolic (congestive) heart failure: Secondary | ICD-10-CM | POA: Diagnosis not present

## 2016-08-29 DIAGNOSIS — B952 Enterococcus as the cause of diseases classified elsewhere: Secondary | ICD-10-CM | POA: Diagnosis not present

## 2016-08-29 DIAGNOSIS — I69328 Other speech and language deficits following cerebral infarction: Secondary | ICD-10-CM | POA: Diagnosis not present

## 2016-08-29 DIAGNOSIS — I482 Chronic atrial fibrillation: Secondary | ICD-10-CM | POA: Diagnosis not present

## 2016-08-29 DIAGNOSIS — M19011 Primary osteoarthritis, right shoulder: Secondary | ICD-10-CM | POA: Diagnosis not present

## 2016-08-29 DIAGNOSIS — R319 Hematuria, unspecified: Secondary | ICD-10-CM | POA: Diagnosis not present

## 2016-08-29 DIAGNOSIS — R1312 Dysphagia, oropharyngeal phase: Secondary | ICD-10-CM | POA: Diagnosis not present

## 2016-08-29 DIAGNOSIS — F329 Major depressive disorder, single episode, unspecified: Secondary | ICD-10-CM | POA: Diagnosis not present

## 2016-08-29 DIAGNOSIS — M6281 Muscle weakness (generalized): Secondary | ICD-10-CM | POA: Diagnosis not present

## 2016-08-29 DIAGNOSIS — G8111 Spastic hemiplegia affecting right dominant side: Secondary | ICD-10-CM | POA: Diagnosis not present

## 2016-08-29 DIAGNOSIS — R2689 Other abnormalities of gait and mobility: Secondary | ICD-10-CM | POA: Diagnosis not present

## 2016-10-08 DIAGNOSIS — R310 Gross hematuria: Secondary | ICD-10-CM | POA: Diagnosis not present

## 2016-10-08 DIAGNOSIS — E119 Type 2 diabetes mellitus without complications: Secondary | ICD-10-CM | POA: Diagnosis not present

## 2016-10-08 DIAGNOSIS — I638 Other cerebral infarction: Secondary | ICD-10-CM | POA: Diagnosis not present

## 2016-10-08 DIAGNOSIS — N390 Urinary tract infection, site not specified: Secondary | ICD-10-CM | POA: Diagnosis not present

## 2016-10-11 DIAGNOSIS — M19011 Primary osteoarthritis, right shoulder: Secondary | ICD-10-CM | POA: Diagnosis not present

## 2016-10-11 DIAGNOSIS — S42201A Unspecified fracture of upper end of right humerus, initial encounter for closed fracture: Secondary | ICD-10-CM | POA: Diagnosis not present

## 2016-10-11 DIAGNOSIS — S4991XA Unspecified injury of right shoulder and upper arm, initial encounter: Secondary | ICD-10-CM | POA: Diagnosis not present

## 2016-10-11 DIAGNOSIS — S42251A Displaced fracture of greater tuberosity of right humerus, initial encounter for closed fracture: Secondary | ICD-10-CM | POA: Diagnosis not present

## 2016-10-11 DIAGNOSIS — S42291A Other displaced fracture of upper end of right humerus, initial encounter for closed fracture: Secondary | ICD-10-CM | POA: Diagnosis not present

## 2016-10-11 DIAGNOSIS — M8580 Other specified disorders of bone density and structure, unspecified site: Secondary | ICD-10-CM | POA: Diagnosis not present

## 2016-11-02 DIAGNOSIS — S42201A Unspecified fracture of upper end of right humerus, initial encounter for closed fracture: Secondary | ICD-10-CM | POA: Diagnosis not present

## 2016-11-02 DIAGNOSIS — S42201D Unspecified fracture of upper end of right humerus, subsequent encounter for fracture with routine healing: Secondary | ICD-10-CM | POA: Diagnosis not present

## 2016-11-04 DIAGNOSIS — M6281 Muscle weakness (generalized): Secondary | ICD-10-CM | POA: Diagnosis not present

## 2016-11-04 DIAGNOSIS — R2689 Other abnormalities of gait and mobility: Secondary | ICD-10-CM | POA: Diagnosis not present

## 2016-11-05 DIAGNOSIS — R2689 Other abnormalities of gait and mobility: Secondary | ICD-10-CM | POA: Diagnosis not present

## 2016-11-05 DIAGNOSIS — M6281 Muscle weakness (generalized): Secondary | ICD-10-CM | POA: Diagnosis not present

## 2016-11-06 DIAGNOSIS — M6281 Muscle weakness (generalized): Secondary | ICD-10-CM | POA: Diagnosis not present

## 2016-11-06 DIAGNOSIS — R2689 Other abnormalities of gait and mobility: Secondary | ICD-10-CM | POA: Diagnosis not present

## 2016-11-08 DIAGNOSIS — R2689 Other abnormalities of gait and mobility: Secondary | ICD-10-CM | POA: Diagnosis not present

## 2016-11-08 DIAGNOSIS — M6281 Muscle weakness (generalized): Secondary | ICD-10-CM | POA: Diagnosis not present

## 2016-11-09 DIAGNOSIS — R2689 Other abnormalities of gait and mobility: Secondary | ICD-10-CM | POA: Diagnosis not present

## 2016-11-09 DIAGNOSIS — M6281 Muscle weakness (generalized): Secondary | ICD-10-CM | POA: Diagnosis not present

## 2016-11-10 DIAGNOSIS — M6281 Muscle weakness (generalized): Secondary | ICD-10-CM | POA: Diagnosis not present

## 2016-11-10 DIAGNOSIS — R2689 Other abnormalities of gait and mobility: Secondary | ICD-10-CM | POA: Diagnosis not present

## 2016-11-11 DIAGNOSIS — M6281 Muscle weakness (generalized): Secondary | ICD-10-CM | POA: Diagnosis not present

## 2016-11-11 DIAGNOSIS — R2689 Other abnormalities of gait and mobility: Secondary | ICD-10-CM | POA: Diagnosis not present

## 2016-11-12 DIAGNOSIS — M6281 Muscle weakness (generalized): Secondary | ICD-10-CM | POA: Diagnosis not present

## 2016-11-12 DIAGNOSIS — R2689 Other abnormalities of gait and mobility: Secondary | ICD-10-CM | POA: Diagnosis not present

## 2016-11-15 DIAGNOSIS — R2689 Other abnormalities of gait and mobility: Secondary | ICD-10-CM | POA: Diagnosis not present

## 2016-11-15 DIAGNOSIS — M6281 Muscle weakness (generalized): Secondary | ICD-10-CM | POA: Diagnosis not present

## 2016-11-16 DIAGNOSIS — R2689 Other abnormalities of gait and mobility: Secondary | ICD-10-CM | POA: Diagnosis not present

## 2016-11-16 DIAGNOSIS — M6281 Muscle weakness (generalized): Secondary | ICD-10-CM | POA: Diagnosis not present

## 2016-11-17 DIAGNOSIS — R2689 Other abnormalities of gait and mobility: Secondary | ICD-10-CM | POA: Diagnosis not present

## 2016-11-17 DIAGNOSIS — N390 Urinary tract infection, site not specified: Secondary | ICD-10-CM | POA: Diagnosis not present

## 2016-11-17 DIAGNOSIS — I638 Other cerebral infarction: Secondary | ICD-10-CM | POA: Diagnosis not present

## 2016-11-17 DIAGNOSIS — R310 Gross hematuria: Secondary | ICD-10-CM | POA: Diagnosis not present

## 2016-11-17 DIAGNOSIS — M6281 Muscle weakness (generalized): Secondary | ICD-10-CM | POA: Diagnosis not present

## 2016-11-17 DIAGNOSIS — E119 Type 2 diabetes mellitus without complications: Secondary | ICD-10-CM | POA: Diagnosis not present

## 2016-11-18 DIAGNOSIS — M6281 Muscle weakness (generalized): Secondary | ICD-10-CM | POA: Diagnosis not present

## 2016-11-18 DIAGNOSIS — R2689 Other abnormalities of gait and mobility: Secondary | ICD-10-CM | POA: Diagnosis not present

## 2016-11-19 DIAGNOSIS — M6281 Muscle weakness (generalized): Secondary | ICD-10-CM | POA: Diagnosis not present

## 2016-11-19 DIAGNOSIS — R2689 Other abnormalities of gait and mobility: Secondary | ICD-10-CM | POA: Diagnosis not present

## 2016-11-22 DIAGNOSIS — R2689 Other abnormalities of gait and mobility: Secondary | ICD-10-CM | POA: Diagnosis not present

## 2016-11-22 DIAGNOSIS — M6281 Muscle weakness (generalized): Secondary | ICD-10-CM | POA: Diagnosis not present

## 2016-11-23 DIAGNOSIS — M6281 Muscle weakness (generalized): Secondary | ICD-10-CM | POA: Diagnosis not present

## 2016-11-23 DIAGNOSIS — R2689 Other abnormalities of gait and mobility: Secondary | ICD-10-CM | POA: Diagnosis not present

## 2016-11-24 DIAGNOSIS — E785 Hyperlipidemia, unspecified: Secondary | ICD-10-CM | POA: Diagnosis not present

## 2016-11-24 DIAGNOSIS — Z5181 Encounter for therapeutic drug level monitoring: Secondary | ICD-10-CM | POA: Diagnosis not present

## 2016-11-24 DIAGNOSIS — M6281 Muscle weakness (generalized): Secondary | ICD-10-CM | POA: Diagnosis not present

## 2016-11-24 DIAGNOSIS — R2689 Other abnormalities of gait and mobility: Secondary | ICD-10-CM | POA: Diagnosis not present

## 2016-11-24 DIAGNOSIS — Z794 Long term (current) use of insulin: Secondary | ICD-10-CM | POA: Diagnosis not present

## 2016-11-25 DIAGNOSIS — R2689 Other abnormalities of gait and mobility: Secondary | ICD-10-CM | POA: Diagnosis not present

## 2016-11-25 DIAGNOSIS — M6281 Muscle weakness (generalized): Secondary | ICD-10-CM | POA: Diagnosis not present

## 2016-11-29 DIAGNOSIS — M6281 Muscle weakness (generalized): Secondary | ICD-10-CM | POA: Diagnosis not present

## 2016-11-29 DIAGNOSIS — R2689 Other abnormalities of gait and mobility: Secondary | ICD-10-CM | POA: Diagnosis not present

## 2016-11-30 DIAGNOSIS — M6281 Muscle weakness (generalized): Secondary | ICD-10-CM | POA: Diagnosis not present

## 2016-11-30 DIAGNOSIS — R2689 Other abnormalities of gait and mobility: Secondary | ICD-10-CM | POA: Diagnosis not present

## 2016-12-01 DIAGNOSIS — R2689 Other abnormalities of gait and mobility: Secondary | ICD-10-CM | POA: Diagnosis not present

## 2016-12-01 DIAGNOSIS — M6281 Muscle weakness (generalized): Secondary | ICD-10-CM | POA: Diagnosis not present

## 2016-12-02 DIAGNOSIS — R2689 Other abnormalities of gait and mobility: Secondary | ICD-10-CM | POA: Diagnosis not present

## 2016-12-02 DIAGNOSIS — M6281 Muscle weakness (generalized): Secondary | ICD-10-CM | POA: Diagnosis not present

## 2016-12-03 DIAGNOSIS — M6281 Muscle weakness (generalized): Secondary | ICD-10-CM | POA: Diagnosis not present

## 2016-12-03 DIAGNOSIS — R2689 Other abnormalities of gait and mobility: Secondary | ICD-10-CM | POA: Diagnosis not present

## 2016-12-06 DIAGNOSIS — R2689 Other abnormalities of gait and mobility: Secondary | ICD-10-CM | POA: Diagnosis not present

## 2016-12-06 DIAGNOSIS — M6281 Muscle weakness (generalized): Secondary | ICD-10-CM | POA: Diagnosis not present

## 2016-12-07 DIAGNOSIS — M6281 Muscle weakness (generalized): Secondary | ICD-10-CM | POA: Diagnosis not present

## 2016-12-07 DIAGNOSIS — R2689 Other abnormalities of gait and mobility: Secondary | ICD-10-CM | POA: Diagnosis not present

## 2016-12-08 DIAGNOSIS — R2689 Other abnormalities of gait and mobility: Secondary | ICD-10-CM | POA: Diagnosis not present

## 2016-12-08 DIAGNOSIS — M6281 Muscle weakness (generalized): Secondary | ICD-10-CM | POA: Diagnosis not present

## 2016-12-09 DIAGNOSIS — M6281 Muscle weakness (generalized): Secondary | ICD-10-CM | POA: Diagnosis not present

## 2016-12-09 DIAGNOSIS — R2689 Other abnormalities of gait and mobility: Secondary | ICD-10-CM | POA: Diagnosis not present

## 2016-12-10 DIAGNOSIS — R2689 Other abnormalities of gait and mobility: Secondary | ICD-10-CM | POA: Diagnosis not present

## 2016-12-10 DIAGNOSIS — M6281 Muscle weakness (generalized): Secondary | ICD-10-CM | POA: Diagnosis not present

## 2016-12-13 DIAGNOSIS — R2689 Other abnormalities of gait and mobility: Secondary | ICD-10-CM | POA: Diagnosis not present

## 2016-12-13 DIAGNOSIS — M6281 Muscle weakness (generalized): Secondary | ICD-10-CM | POA: Diagnosis not present

## 2016-12-14 DIAGNOSIS — R2689 Other abnormalities of gait and mobility: Secondary | ICD-10-CM | POA: Diagnosis not present

## 2016-12-14 DIAGNOSIS — M6281 Muscle weakness (generalized): Secondary | ICD-10-CM | POA: Diagnosis not present

## 2016-12-15 DIAGNOSIS — R2689 Other abnormalities of gait and mobility: Secondary | ICD-10-CM | POA: Diagnosis not present

## 2016-12-15 DIAGNOSIS — M6281 Muscle weakness (generalized): Secondary | ICD-10-CM | POA: Diagnosis not present

## 2016-12-16 DIAGNOSIS — R2689 Other abnormalities of gait and mobility: Secondary | ICD-10-CM | POA: Diagnosis not present

## 2016-12-16 DIAGNOSIS — M6281 Muscle weakness (generalized): Secondary | ICD-10-CM | POA: Diagnosis not present

## 2016-12-17 DIAGNOSIS — M6281 Muscle weakness (generalized): Secondary | ICD-10-CM | POA: Diagnosis not present

## 2016-12-17 DIAGNOSIS — R2689 Other abnormalities of gait and mobility: Secondary | ICD-10-CM | POA: Diagnosis not present

## 2016-12-20 DIAGNOSIS — M6281 Muscle weakness (generalized): Secondary | ICD-10-CM | POA: Diagnosis not present

## 2016-12-20 DIAGNOSIS — R2689 Other abnormalities of gait and mobility: Secondary | ICD-10-CM | POA: Diagnosis not present

## 2016-12-21 DIAGNOSIS — M6281 Muscle weakness (generalized): Secondary | ICD-10-CM | POA: Diagnosis not present

## 2016-12-21 DIAGNOSIS — R2689 Other abnormalities of gait and mobility: Secondary | ICD-10-CM | POA: Diagnosis not present

## 2016-12-22 DIAGNOSIS — N390 Urinary tract infection, site not specified: Secondary | ICD-10-CM | POA: Diagnosis not present

## 2016-12-22 DIAGNOSIS — I638 Other cerebral infarction: Secondary | ICD-10-CM | POA: Diagnosis not present

## 2016-12-22 DIAGNOSIS — R2689 Other abnormalities of gait and mobility: Secondary | ICD-10-CM | POA: Diagnosis not present

## 2016-12-22 DIAGNOSIS — R310 Gross hematuria: Secondary | ICD-10-CM | POA: Diagnosis not present

## 2016-12-22 DIAGNOSIS — E119 Type 2 diabetes mellitus without complications: Secondary | ICD-10-CM | POA: Diagnosis not present

## 2016-12-22 DIAGNOSIS — M6281 Muscle weakness (generalized): Secondary | ICD-10-CM | POA: Diagnosis not present

## 2016-12-23 DIAGNOSIS — R2689 Other abnormalities of gait and mobility: Secondary | ICD-10-CM | POA: Diagnosis not present

## 2016-12-23 DIAGNOSIS — M6281 Muscle weakness (generalized): Secondary | ICD-10-CM | POA: Diagnosis not present

## 2016-12-24 DIAGNOSIS — R2689 Other abnormalities of gait and mobility: Secondary | ICD-10-CM | POA: Diagnosis not present

## 2016-12-24 DIAGNOSIS — M6281 Muscle weakness (generalized): Secondary | ICD-10-CM | POA: Diagnosis not present

## 2016-12-27 DIAGNOSIS — R2689 Other abnormalities of gait and mobility: Secondary | ICD-10-CM | POA: Diagnosis not present

## 2016-12-27 DIAGNOSIS — M6281 Muscle weakness (generalized): Secondary | ICD-10-CM | POA: Diagnosis not present

## 2016-12-28 DIAGNOSIS — R2689 Other abnormalities of gait and mobility: Secondary | ICD-10-CM | POA: Diagnosis not present

## 2016-12-28 DIAGNOSIS — M6281 Muscle weakness (generalized): Secondary | ICD-10-CM | POA: Diagnosis not present

## 2016-12-29 DIAGNOSIS — M6281 Muscle weakness (generalized): Secondary | ICD-10-CM | POA: Diagnosis not present

## 2016-12-29 DIAGNOSIS — R2689 Other abnormalities of gait and mobility: Secondary | ICD-10-CM | POA: Diagnosis not present

## 2016-12-30 DIAGNOSIS — M6281 Muscle weakness (generalized): Secondary | ICD-10-CM | POA: Diagnosis not present

## 2016-12-30 DIAGNOSIS — R2689 Other abnormalities of gait and mobility: Secondary | ICD-10-CM | POA: Diagnosis not present

## 2016-12-31 DIAGNOSIS — R2689 Other abnormalities of gait and mobility: Secondary | ICD-10-CM | POA: Diagnosis not present

## 2016-12-31 DIAGNOSIS — M6281 Muscle weakness (generalized): Secondary | ICD-10-CM | POA: Diagnosis not present

## 2017-01-01 DIAGNOSIS — R2689 Other abnormalities of gait and mobility: Secondary | ICD-10-CM | POA: Diagnosis not present

## 2017-01-01 DIAGNOSIS — M6281 Muscle weakness (generalized): Secondary | ICD-10-CM | POA: Diagnosis not present

## 2017-01-03 DIAGNOSIS — R2689 Other abnormalities of gait and mobility: Secondary | ICD-10-CM | POA: Diagnosis not present

## 2017-01-03 DIAGNOSIS — M6281 Muscle weakness (generalized): Secondary | ICD-10-CM | POA: Diagnosis not present

## 2017-01-04 DIAGNOSIS — M6281 Muscle weakness (generalized): Secondary | ICD-10-CM | POA: Diagnosis not present

## 2017-01-04 DIAGNOSIS — R2689 Other abnormalities of gait and mobility: Secondary | ICD-10-CM | POA: Diagnosis not present

## 2017-01-05 DIAGNOSIS — R2689 Other abnormalities of gait and mobility: Secondary | ICD-10-CM | POA: Diagnosis not present

## 2017-01-05 DIAGNOSIS — M6281 Muscle weakness (generalized): Secondary | ICD-10-CM | POA: Diagnosis not present

## 2017-01-06 DIAGNOSIS — M6281 Muscle weakness (generalized): Secondary | ICD-10-CM | POA: Diagnosis not present

## 2017-01-06 DIAGNOSIS — R2689 Other abnormalities of gait and mobility: Secondary | ICD-10-CM | POA: Diagnosis not present

## 2017-01-07 DIAGNOSIS — M6281 Muscle weakness (generalized): Secondary | ICD-10-CM | POA: Diagnosis not present

## 2017-01-07 DIAGNOSIS — R2689 Other abnormalities of gait and mobility: Secondary | ICD-10-CM | POA: Diagnosis not present

## 2017-01-10 DIAGNOSIS — R2689 Other abnormalities of gait and mobility: Secondary | ICD-10-CM | POA: Diagnosis not present

## 2017-01-10 DIAGNOSIS — M6281 Muscle weakness (generalized): Secondary | ICD-10-CM | POA: Diagnosis not present

## 2017-01-11 DIAGNOSIS — M6281 Muscle weakness (generalized): Secondary | ICD-10-CM | POA: Diagnosis not present

## 2017-01-11 DIAGNOSIS — R2689 Other abnormalities of gait and mobility: Secondary | ICD-10-CM | POA: Diagnosis not present

## 2017-01-12 DIAGNOSIS — R2689 Other abnormalities of gait and mobility: Secondary | ICD-10-CM | POA: Diagnosis not present

## 2017-01-12 DIAGNOSIS — M6281 Muscle weakness (generalized): Secondary | ICD-10-CM | POA: Diagnosis not present

## 2017-01-13 DIAGNOSIS — R2689 Other abnormalities of gait and mobility: Secondary | ICD-10-CM | POA: Diagnosis not present

## 2017-01-13 DIAGNOSIS — M6281 Muscle weakness (generalized): Secondary | ICD-10-CM | POA: Diagnosis not present

## 2017-01-14 DIAGNOSIS — M6281 Muscle weakness (generalized): Secondary | ICD-10-CM | POA: Diagnosis not present

## 2017-01-14 DIAGNOSIS — R2689 Other abnormalities of gait and mobility: Secondary | ICD-10-CM | POA: Diagnosis not present

## 2017-01-17 DIAGNOSIS — M6281 Muscle weakness (generalized): Secondary | ICD-10-CM | POA: Diagnosis not present

## 2017-01-17 DIAGNOSIS — R2689 Other abnormalities of gait and mobility: Secondary | ICD-10-CM | POA: Diagnosis not present

## 2017-01-18 DIAGNOSIS — R2689 Other abnormalities of gait and mobility: Secondary | ICD-10-CM | POA: Diagnosis not present

## 2017-01-18 DIAGNOSIS — M6281 Muscle weakness (generalized): Secondary | ICD-10-CM | POA: Diagnosis not present

## 2017-01-19 DIAGNOSIS — R2689 Other abnormalities of gait and mobility: Secondary | ICD-10-CM | POA: Diagnosis not present

## 2017-01-19 DIAGNOSIS — M6281 Muscle weakness (generalized): Secondary | ICD-10-CM | POA: Diagnosis not present

## 2017-01-20 DIAGNOSIS — R2689 Other abnormalities of gait and mobility: Secondary | ICD-10-CM | POA: Diagnosis not present

## 2017-01-20 DIAGNOSIS — M6281 Muscle weakness (generalized): Secondary | ICD-10-CM | POA: Diagnosis not present

## 2017-01-21 DIAGNOSIS — R2689 Other abnormalities of gait and mobility: Secondary | ICD-10-CM | POA: Diagnosis not present

## 2017-01-21 DIAGNOSIS — M6281 Muscle weakness (generalized): Secondary | ICD-10-CM | POA: Diagnosis not present

## 2017-02-05 DIAGNOSIS — N390 Urinary tract infection, site not specified: Secondary | ICD-10-CM | POA: Diagnosis not present

## 2017-02-05 DIAGNOSIS — I638 Other cerebral infarction: Secondary | ICD-10-CM | POA: Diagnosis not present

## 2017-02-05 DIAGNOSIS — E119 Type 2 diabetes mellitus without complications: Secondary | ICD-10-CM | POA: Diagnosis not present

## 2017-02-05 DIAGNOSIS — R310 Gross hematuria: Secondary | ICD-10-CM | POA: Diagnosis not present

## 2017-02-23 DIAGNOSIS — Z5181 Encounter for therapeutic drug level monitoring: Secondary | ICD-10-CM | POA: Diagnosis not present

## 2017-02-23 DIAGNOSIS — Z794 Long term (current) use of insulin: Secondary | ICD-10-CM | POA: Diagnosis not present

## 2017-03-24 DIAGNOSIS — E038 Other specified hypothyroidism: Secondary | ICD-10-CM | POA: Diagnosis not present

## 2017-03-24 DIAGNOSIS — I6389 Other cerebral infarction: Secondary | ICD-10-CM | POA: Diagnosis not present

## 2017-03-24 DIAGNOSIS — R310 Gross hematuria: Secondary | ICD-10-CM | POA: Diagnosis not present

## 2017-03-24 DIAGNOSIS — E119 Type 2 diabetes mellitus without complications: Secondary | ICD-10-CM | POA: Diagnosis not present

## 2017-05-12 DIAGNOSIS — E038 Other specified hypothyroidism: Secondary | ICD-10-CM | POA: Diagnosis not present

## 2017-05-12 DIAGNOSIS — E119 Type 2 diabetes mellitus without complications: Secondary | ICD-10-CM | POA: Diagnosis not present

## 2017-05-12 DIAGNOSIS — R310 Gross hematuria: Secondary | ICD-10-CM | POA: Diagnosis not present

## 2017-05-12 DIAGNOSIS — I6389 Other cerebral infarction: Secondary | ICD-10-CM | POA: Diagnosis not present

## 2017-06-08 DIAGNOSIS — M6281 Muscle weakness (generalized): Secondary | ICD-10-CM | POA: Diagnosis not present

## 2017-06-08 DIAGNOSIS — R278 Other lack of coordination: Secondary | ICD-10-CM | POA: Diagnosis not present

## 2017-06-09 DIAGNOSIS — R278 Other lack of coordination: Secondary | ICD-10-CM | POA: Diagnosis not present

## 2017-06-09 DIAGNOSIS — M6281 Muscle weakness (generalized): Secondary | ICD-10-CM | POA: Diagnosis not present

## 2017-06-10 DIAGNOSIS — M6281 Muscle weakness (generalized): Secondary | ICD-10-CM | POA: Diagnosis not present

## 2017-06-10 DIAGNOSIS — R278 Other lack of coordination: Secondary | ICD-10-CM | POA: Diagnosis not present

## 2017-06-12 DIAGNOSIS — R278 Other lack of coordination: Secondary | ICD-10-CM | POA: Diagnosis not present

## 2017-06-12 DIAGNOSIS — M6281 Muscle weakness (generalized): Secondary | ICD-10-CM | POA: Diagnosis not present

## 2017-06-13 DIAGNOSIS — M6281 Muscle weakness (generalized): Secondary | ICD-10-CM | POA: Diagnosis not present

## 2017-06-13 DIAGNOSIS — R278 Other lack of coordination: Secondary | ICD-10-CM | POA: Diagnosis not present

## 2017-06-15 DIAGNOSIS — M6281 Muscle weakness (generalized): Secondary | ICD-10-CM | POA: Diagnosis not present

## 2017-06-15 DIAGNOSIS — R278 Other lack of coordination: Secondary | ICD-10-CM | POA: Diagnosis not present

## 2017-06-16 DIAGNOSIS — M6281 Muscle weakness (generalized): Secondary | ICD-10-CM | POA: Diagnosis not present

## 2017-06-16 DIAGNOSIS — R278 Other lack of coordination: Secondary | ICD-10-CM | POA: Diagnosis not present

## 2017-06-17 DIAGNOSIS — R278 Other lack of coordination: Secondary | ICD-10-CM | POA: Diagnosis not present

## 2017-06-17 DIAGNOSIS — M6281 Muscle weakness (generalized): Secondary | ICD-10-CM | POA: Diagnosis not present

## 2017-06-20 DIAGNOSIS — M6281 Muscle weakness (generalized): Secondary | ICD-10-CM | POA: Diagnosis not present

## 2017-06-20 DIAGNOSIS — R278 Other lack of coordination: Secondary | ICD-10-CM | POA: Diagnosis not present

## 2017-06-21 DIAGNOSIS — R278 Other lack of coordination: Secondary | ICD-10-CM | POA: Diagnosis not present

## 2017-06-21 DIAGNOSIS — R1310 Dysphagia, unspecified: Secondary | ICD-10-CM | POA: Diagnosis not present

## 2017-06-21 DIAGNOSIS — R1319 Other dysphagia: Secondary | ICD-10-CM | POA: Diagnosis not present

## 2017-06-21 DIAGNOSIS — M6281 Muscle weakness (generalized): Secondary | ICD-10-CM | POA: Diagnosis not present

## 2017-06-22 DIAGNOSIS — M6281 Muscle weakness (generalized): Secondary | ICD-10-CM | POA: Diagnosis not present

## 2017-06-22 DIAGNOSIS — R278 Other lack of coordination: Secondary | ICD-10-CM | POA: Diagnosis not present

## 2017-06-22 DIAGNOSIS — R1319 Other dysphagia: Secondary | ICD-10-CM | POA: Diagnosis not present

## 2017-06-22 DIAGNOSIS — R1310 Dysphagia, unspecified: Secondary | ICD-10-CM | POA: Diagnosis not present

## 2017-06-23 DIAGNOSIS — M6281 Muscle weakness (generalized): Secondary | ICD-10-CM | POA: Diagnosis not present

## 2017-06-23 DIAGNOSIS — R278 Other lack of coordination: Secondary | ICD-10-CM | POA: Diagnosis not present

## 2017-06-23 DIAGNOSIS — R1310 Dysphagia, unspecified: Secondary | ICD-10-CM | POA: Diagnosis not present

## 2017-06-23 DIAGNOSIS — R1319 Other dysphagia: Secondary | ICD-10-CM | POA: Diagnosis not present

## 2017-06-23 IMAGING — CR DG CHEST 1V PORT
1 series · 1 of 1 positions shown · non-contrast
Comparison: Chest radiograph from [REDACTED] August 01, 2016 at 2200 hours

CLINICAL DATA: Seen at outside hospital 3 days ago for shortness of
breath and diarrhea. History of pulmonary hypertension, diabetes,
heart failure.

EXAM:
PORTABLE CHEST 1 VIEW

[portable]
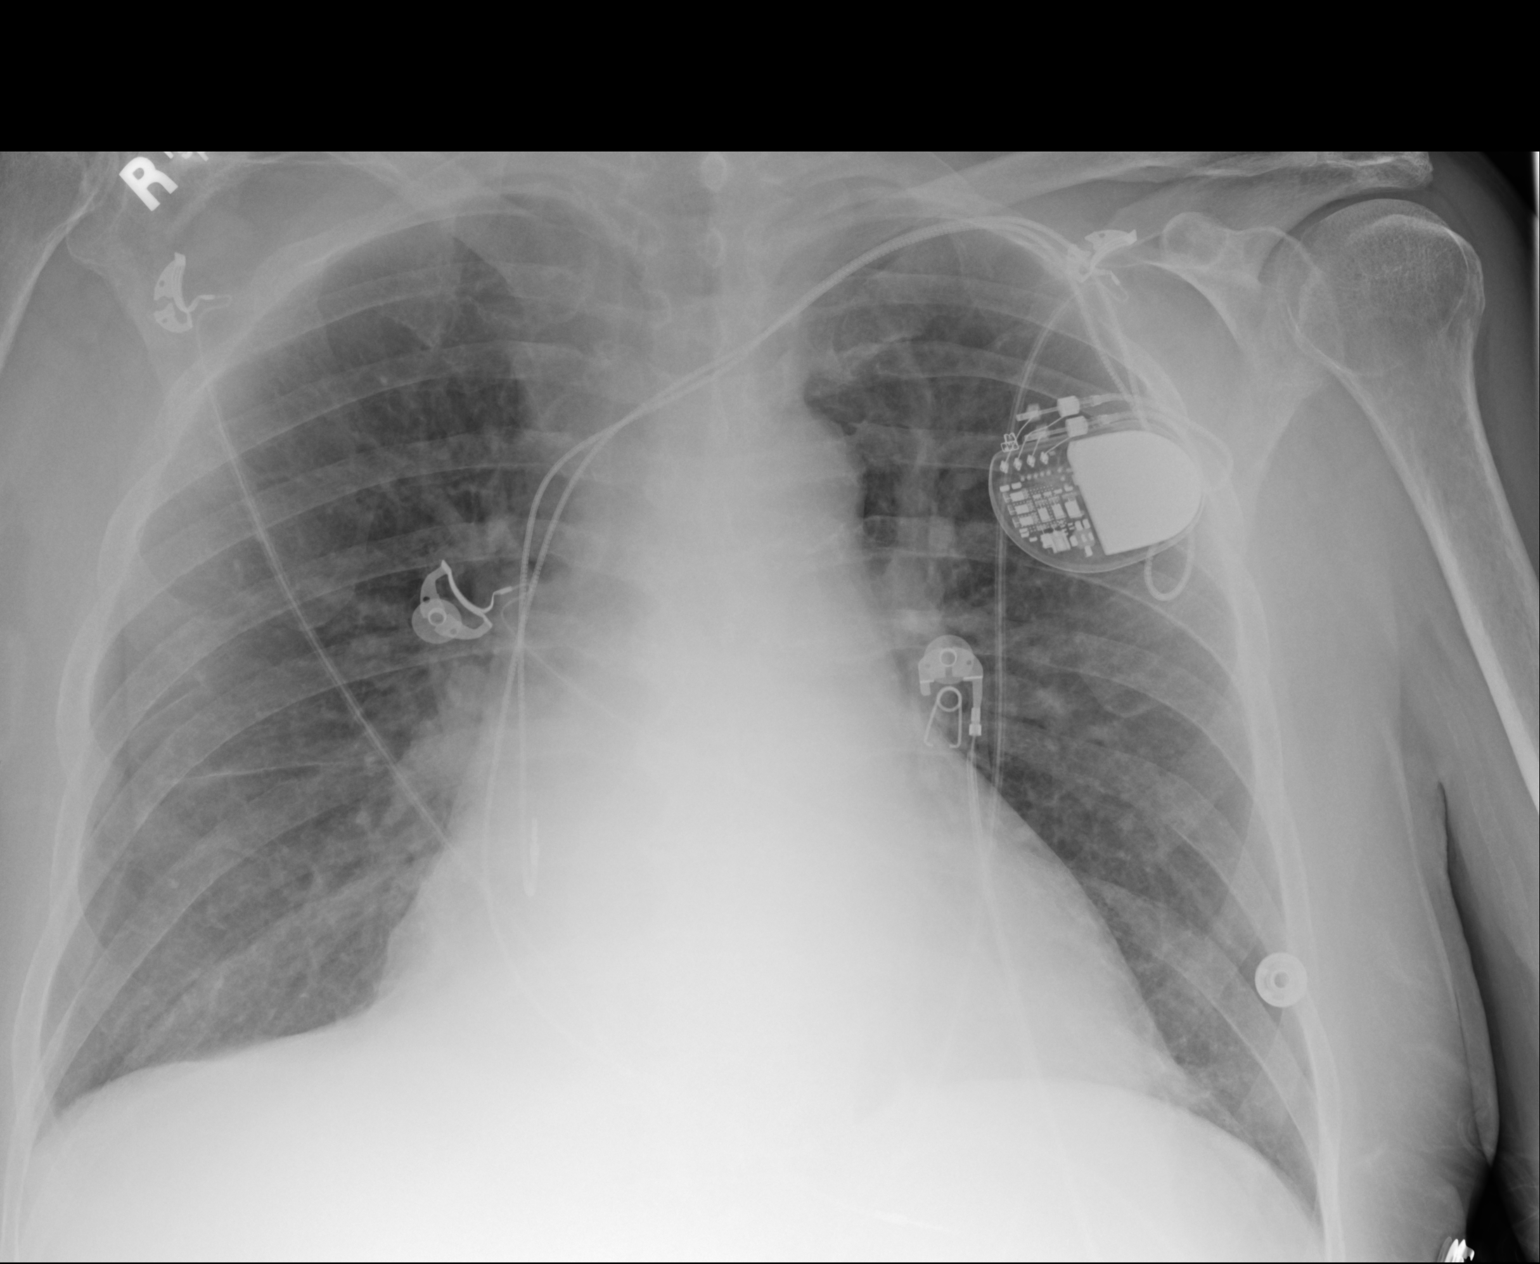

[1 of 1 positions shown; findings below may reference images not displayed]

FINDINGS: Cardiac silhouette is moderately enlarged unchanged. Similar
pulmonary vascular congestion without pleural effusion or focal
consolidation. No pneumothorax. Dual lead LEFT cardiac pacemaker in
situ. Soft tissue planes and included osseous structures are
nonsuspicious.
IMPRESSION: Stable examination:  Cardiomegaly and pulmonary vascular congestion.

## 2017-06-24 DIAGNOSIS — R1310 Dysphagia, unspecified: Secondary | ICD-10-CM | POA: Diagnosis not present

## 2017-06-24 DIAGNOSIS — R1319 Other dysphagia: Secondary | ICD-10-CM | POA: Diagnosis not present

## 2017-06-24 DIAGNOSIS — M6281 Muscle weakness (generalized): Secondary | ICD-10-CM | POA: Diagnosis not present

## 2017-06-24 DIAGNOSIS — R278 Other lack of coordination: Secondary | ICD-10-CM | POA: Diagnosis not present

## 2017-06-27 DIAGNOSIS — R1310 Dysphagia, unspecified: Secondary | ICD-10-CM | POA: Diagnosis not present

## 2017-06-27 DIAGNOSIS — M6281 Muscle weakness (generalized): Secondary | ICD-10-CM | POA: Diagnosis not present

## 2017-06-27 DIAGNOSIS — R278 Other lack of coordination: Secondary | ICD-10-CM | POA: Diagnosis not present

## 2017-06-27 DIAGNOSIS — R1319 Other dysphagia: Secondary | ICD-10-CM | POA: Diagnosis not present

## 2017-06-28 DIAGNOSIS — R1310 Dysphagia, unspecified: Secondary | ICD-10-CM | POA: Diagnosis not present

## 2017-06-28 DIAGNOSIS — R278 Other lack of coordination: Secondary | ICD-10-CM | POA: Diagnosis not present

## 2017-06-28 DIAGNOSIS — M6281 Muscle weakness (generalized): Secondary | ICD-10-CM | POA: Diagnosis not present

## 2017-06-28 DIAGNOSIS — R1319 Other dysphagia: Secondary | ICD-10-CM | POA: Diagnosis not present

## 2017-06-29 DIAGNOSIS — R1319 Other dysphagia: Secondary | ICD-10-CM | POA: Diagnosis not present

## 2017-06-29 DIAGNOSIS — M6281 Muscle weakness (generalized): Secondary | ICD-10-CM | POA: Diagnosis not present

## 2017-06-29 DIAGNOSIS — R1310 Dysphagia, unspecified: Secondary | ICD-10-CM | POA: Diagnosis not present

## 2017-06-29 DIAGNOSIS — R278 Other lack of coordination: Secondary | ICD-10-CM | POA: Diagnosis not present

## 2017-06-30 DIAGNOSIS — M6281 Muscle weakness (generalized): Secondary | ICD-10-CM | POA: Diagnosis not present

## 2017-06-30 DIAGNOSIS — R278 Other lack of coordination: Secondary | ICD-10-CM | POA: Diagnosis not present

## 2017-06-30 DIAGNOSIS — R1310 Dysphagia, unspecified: Secondary | ICD-10-CM | POA: Diagnosis not present

## 2017-06-30 DIAGNOSIS — R1319 Other dysphagia: Secondary | ICD-10-CM | POA: Diagnosis not present

## 2017-07-01 DIAGNOSIS — M6281 Muscle weakness (generalized): Secondary | ICD-10-CM | POA: Diagnosis not present

## 2017-07-01 DIAGNOSIS — R1319 Other dysphagia: Secondary | ICD-10-CM | POA: Diagnosis not present

## 2017-07-01 DIAGNOSIS — R278 Other lack of coordination: Secondary | ICD-10-CM | POA: Diagnosis not present

## 2017-07-01 DIAGNOSIS — R1310 Dysphagia, unspecified: Secondary | ICD-10-CM | POA: Diagnosis not present

## 2017-07-02 DIAGNOSIS — R310 Gross hematuria: Secondary | ICD-10-CM | POA: Diagnosis not present

## 2017-07-02 DIAGNOSIS — I6389 Other cerebral infarction: Secondary | ICD-10-CM | POA: Diagnosis not present

## 2017-07-02 DIAGNOSIS — E119 Type 2 diabetes mellitus without complications: Secondary | ICD-10-CM | POA: Diagnosis not present

## 2017-07-02 DIAGNOSIS — E038 Other specified hypothyroidism: Secondary | ICD-10-CM | POA: Diagnosis not present

## 2017-07-04 DIAGNOSIS — R1319 Other dysphagia: Secondary | ICD-10-CM | POA: Diagnosis not present

## 2017-07-04 DIAGNOSIS — R1310 Dysphagia, unspecified: Secondary | ICD-10-CM | POA: Diagnosis not present

## 2017-07-04 DIAGNOSIS — M6281 Muscle weakness (generalized): Secondary | ICD-10-CM | POA: Diagnosis not present

## 2017-07-04 DIAGNOSIS — R278 Other lack of coordination: Secondary | ICD-10-CM | POA: Diagnosis not present

## 2017-07-05 DIAGNOSIS — M6281 Muscle weakness (generalized): Secondary | ICD-10-CM | POA: Diagnosis not present

## 2017-07-05 DIAGNOSIS — R1319 Other dysphagia: Secondary | ICD-10-CM | POA: Diagnosis not present

## 2017-07-05 DIAGNOSIS — R1310 Dysphagia, unspecified: Secondary | ICD-10-CM | POA: Diagnosis not present

## 2017-07-05 DIAGNOSIS — R278 Other lack of coordination: Secondary | ICD-10-CM | POA: Diagnosis not present

## 2017-07-06 DIAGNOSIS — R1319 Other dysphagia: Secondary | ICD-10-CM | POA: Diagnosis not present

## 2017-07-06 DIAGNOSIS — R278 Other lack of coordination: Secondary | ICD-10-CM | POA: Diagnosis not present

## 2017-07-06 DIAGNOSIS — M6281 Muscle weakness (generalized): Secondary | ICD-10-CM | POA: Diagnosis not present

## 2017-07-06 DIAGNOSIS — R1310 Dysphagia, unspecified: Secondary | ICD-10-CM | POA: Diagnosis not present

## 2017-07-07 DIAGNOSIS — M6281 Muscle weakness (generalized): Secondary | ICD-10-CM | POA: Diagnosis not present

## 2017-07-07 DIAGNOSIS — R8290 Unspecified abnormal findings in urine: Secondary | ICD-10-CM | POA: Diagnosis not present

## 2017-07-07 DIAGNOSIS — R278 Other lack of coordination: Secondary | ICD-10-CM | POA: Diagnosis not present

## 2017-07-07 DIAGNOSIS — R410 Disorientation, unspecified: Secondary | ICD-10-CM | POA: Diagnosis not present

## 2017-07-07 DIAGNOSIS — R1319 Other dysphagia: Secondary | ICD-10-CM | POA: Diagnosis not present

## 2017-07-07 DIAGNOSIS — R1310 Dysphagia, unspecified: Secondary | ICD-10-CM | POA: Diagnosis not present

## 2017-07-08 DIAGNOSIS — M6281 Muscle weakness (generalized): Secondary | ICD-10-CM | POA: Diagnosis not present

## 2017-07-08 DIAGNOSIS — R1310 Dysphagia, unspecified: Secondary | ICD-10-CM | POA: Diagnosis not present

## 2017-07-08 DIAGNOSIS — R1319 Other dysphagia: Secondary | ICD-10-CM | POA: Diagnosis not present

## 2017-07-08 DIAGNOSIS — R278 Other lack of coordination: Secondary | ICD-10-CM | POA: Diagnosis not present

## 2017-07-11 DIAGNOSIS — R278 Other lack of coordination: Secondary | ICD-10-CM | POA: Diagnosis not present

## 2017-07-11 DIAGNOSIS — R1310 Dysphagia, unspecified: Secondary | ICD-10-CM | POA: Diagnosis not present

## 2017-07-11 DIAGNOSIS — M6281 Muscle weakness (generalized): Secondary | ICD-10-CM | POA: Diagnosis not present

## 2017-07-11 DIAGNOSIS — R1319 Other dysphagia: Secondary | ICD-10-CM | POA: Diagnosis not present

## 2017-07-12 DIAGNOSIS — R1319 Other dysphagia: Secondary | ICD-10-CM | POA: Diagnosis not present

## 2017-07-12 DIAGNOSIS — M6281 Muscle weakness (generalized): Secondary | ICD-10-CM | POA: Diagnosis not present

## 2017-07-12 DIAGNOSIS — R1310 Dysphagia, unspecified: Secondary | ICD-10-CM | POA: Diagnosis not present

## 2017-07-12 DIAGNOSIS — R278 Other lack of coordination: Secondary | ICD-10-CM | POA: Diagnosis not present

## 2017-07-25 DIAGNOSIS — R8290 Unspecified abnormal findings in urine: Secondary | ICD-10-CM | POA: Diagnosis not present

## 2017-08-18 DIAGNOSIS — E119 Type 2 diabetes mellitus without complications: Secondary | ICD-10-CM | POA: Diagnosis not present

## 2017-08-23 DIAGNOSIS — R310 Gross hematuria: Secondary | ICD-10-CM | POA: Diagnosis not present

## 2017-08-23 DIAGNOSIS — E038 Other specified hypothyroidism: Secondary | ICD-10-CM | POA: Diagnosis not present

## 2017-08-23 DIAGNOSIS — I6389 Other cerebral infarction: Secondary | ICD-10-CM | POA: Diagnosis not present

## 2017-08-23 DIAGNOSIS — E119 Type 2 diabetes mellitus without complications: Secondary | ICD-10-CM | POA: Diagnosis not present

## 2017-09-05 DIAGNOSIS — Z1389 Encounter for screening for other disorder: Secondary | ICD-10-CM | POA: Diagnosis not present

## 2017-09-05 DIAGNOSIS — Z0000 Encounter for general adult medical examination without abnormal findings: Secondary | ICD-10-CM | POA: Diagnosis not present

## 2017-09-15 DIAGNOSIS — M25551 Pain in right hip: Secondary | ICD-10-CM | POA: Diagnosis not present

## 2017-09-15 DIAGNOSIS — M25552 Pain in left hip: Secondary | ICD-10-CM | POA: Diagnosis not present

## 2017-09-18 DIAGNOSIS — R05 Cough: Secondary | ICD-10-CM | POA: Diagnosis not present

## 2017-09-18 DIAGNOSIS — R6889 Other general symptoms and signs: Secondary | ICD-10-CM | POA: Diagnosis not present

## 2017-10-13 DIAGNOSIS — M810 Age-related osteoporosis without current pathological fracture: Secondary | ICD-10-CM | POA: Diagnosis not present

## 2017-10-13 DIAGNOSIS — M85852 Other specified disorders of bone density and structure, left thigh: Secondary | ICD-10-CM | POA: Diagnosis not present

## 2017-10-17 DIAGNOSIS — I6389 Other cerebral infarction: Secondary | ICD-10-CM | POA: Diagnosis not present

## 2017-10-17 DIAGNOSIS — E119 Type 2 diabetes mellitus without complications: Secondary | ICD-10-CM | POA: Diagnosis not present

## 2017-10-17 DIAGNOSIS — R310 Gross hematuria: Secondary | ICD-10-CM | POA: Diagnosis not present

## 2017-10-17 DIAGNOSIS — E038 Other specified hypothyroidism: Secondary | ICD-10-CM | POA: Diagnosis not present

## 2017-10-19 ENCOUNTER — Ambulatory Visit (INDEPENDENT_AMBULATORY_CARE_PROVIDER_SITE_OTHER): Payer: Medicare Other | Admitting: Urology

## 2017-10-19 DIAGNOSIS — R338 Other retention of urine: Secondary | ICD-10-CM

## 2017-10-21 ENCOUNTER — Other Ambulatory Visit: Payer: Self-pay | Admitting: Urology

## 2017-10-24 DIAGNOSIS — R9431 Abnormal electrocardiogram [ECG] [EKG]: Secondary | ICD-10-CM | POA: Diagnosis not present

## 2017-10-24 DIAGNOSIS — R531 Weakness: Secondary | ICD-10-CM | POA: Diagnosis not present

## 2017-10-24 DIAGNOSIS — J309 Allergic rhinitis, unspecified: Secondary | ICD-10-CM | POA: Diagnosis present

## 2017-10-24 DIAGNOSIS — I110 Hypertensive heart disease with heart failure: Secondary | ICD-10-CM | POA: Diagnosis present

## 2017-10-24 DIAGNOSIS — R404 Transient alteration of awareness: Secondary | ICD-10-CM | POA: Diagnosis not present

## 2017-10-24 DIAGNOSIS — K8020 Calculus of gallbladder without cholecystitis without obstruction: Secondary | ICD-10-CM | POA: Diagnosis present

## 2017-10-24 DIAGNOSIS — J180 Bronchopneumonia, unspecified organism: Secondary | ICD-10-CM | POA: Diagnosis not present

## 2017-10-24 DIAGNOSIS — A4189 Other specified sepsis: Secondary | ICD-10-CM | POA: Diagnosis not present

## 2017-10-24 DIAGNOSIS — R339 Retention of urine, unspecified: Secondary | ICD-10-CM | POA: Diagnosis not present

## 2017-10-24 DIAGNOSIS — E039 Hypothyroidism, unspecified: Secondary | ICD-10-CM | POA: Diagnosis present

## 2017-10-24 DIAGNOSIS — J168 Pneumonia due to other specified infectious organisms: Secondary | ICD-10-CM | POA: Diagnosis not present

## 2017-10-24 DIAGNOSIS — Z7401 Bed confinement status: Secondary | ICD-10-CM | POA: Diagnosis not present

## 2017-10-24 DIAGNOSIS — R509 Fever, unspecified: Secondary | ICD-10-CM | POA: Diagnosis not present

## 2017-10-24 DIAGNOSIS — F0390 Unspecified dementia without behavioral disturbance: Secondary | ICD-10-CM | POA: Diagnosis present

## 2017-10-24 DIAGNOSIS — A419 Sepsis, unspecified organism: Secondary | ICD-10-CM | POA: Diagnosis not present

## 2017-10-24 DIAGNOSIS — I5032 Chronic diastolic (congestive) heart failure: Secondary | ICD-10-CM | POA: Diagnosis not present

## 2017-10-24 DIAGNOSIS — M6281 Muscle weakness (generalized): Secondary | ICD-10-CM | POA: Diagnosis not present

## 2017-10-24 DIAGNOSIS — R2689 Other abnormalities of gait and mobility: Secondary | ICD-10-CM | POA: Diagnosis not present

## 2017-10-24 DIAGNOSIS — E860 Dehydration: Secondary | ICD-10-CM | POA: Diagnosis not present

## 2017-10-24 DIAGNOSIS — I509 Heart failure, unspecified: Secondary | ICD-10-CM | POA: Diagnosis not present

## 2017-10-24 DIAGNOSIS — E785 Hyperlipidemia, unspecified: Secondary | ICD-10-CM | POA: Diagnosis present

## 2017-10-24 DIAGNOSIS — Z79899 Other long term (current) drug therapy: Secondary | ICD-10-CM | POA: Diagnosis not present

## 2017-10-24 DIAGNOSIS — R338 Other retention of urine: Secondary | ICD-10-CM | POA: Diagnosis present

## 2017-10-24 DIAGNOSIS — E119 Type 2 diabetes mellitus without complications: Secondary | ICD-10-CM | POA: Diagnosis present

## 2017-10-24 DIAGNOSIS — K219 Gastro-esophageal reflux disease without esophagitis: Secondary | ICD-10-CM | POA: Diagnosis present

## 2017-10-24 DIAGNOSIS — N401 Enlarged prostate with lower urinary tract symptoms: Secondary | ICD-10-CM | POA: Diagnosis present

## 2017-10-24 DIAGNOSIS — F329 Major depressive disorder, single episode, unspecified: Secondary | ICD-10-CM | POA: Diagnosis present

## 2017-10-24 DIAGNOSIS — I482 Chronic atrial fibrillation: Secondary | ICD-10-CM | POA: Diagnosis present

## 2017-10-24 DIAGNOSIS — I5040 Unspecified combined systolic (congestive) and diastolic (congestive) heart failure: Secondary | ICD-10-CM | POA: Diagnosis not present

## 2017-10-24 DIAGNOSIS — B964 Proteus (mirabilis) (morganii) as the cause of diseases classified elsewhere: Secondary | ICD-10-CM | POA: Diagnosis not present

## 2017-10-24 DIAGNOSIS — E873 Alkalosis: Secondary | ICD-10-CM | POA: Diagnosis present

## 2017-10-24 DIAGNOSIS — Z66 Do not resuscitate: Secondary | ICD-10-CM | POA: Diagnosis present

## 2017-10-24 DIAGNOSIS — Z888 Allergy status to other drugs, medicaments and biological substances status: Secondary | ICD-10-CM | POA: Diagnosis not present

## 2017-10-24 DIAGNOSIS — Z87891 Personal history of nicotine dependence: Secondary | ICD-10-CM | POA: Diagnosis not present

## 2017-10-24 DIAGNOSIS — R279 Unspecified lack of coordination: Secondary | ICD-10-CM | POA: Diagnosis not present

## 2017-10-24 DIAGNOSIS — Z794 Long term (current) use of insulin: Secondary | ICD-10-CM | POA: Diagnosis not present

## 2017-10-24 DIAGNOSIS — N390 Urinary tract infection, site not specified: Secondary | ICD-10-CM | POA: Diagnosis present

## 2017-10-24 DIAGNOSIS — Z7982 Long term (current) use of aspirin: Secondary | ICD-10-CM | POA: Diagnosis not present

## 2017-10-27 DIAGNOSIS — B964 Proteus (mirabilis) (morganii) as the cause of diseases classified elsewhere: Secondary | ICD-10-CM | POA: Diagnosis not present

## 2017-10-27 DIAGNOSIS — E860 Dehydration: Secondary | ICD-10-CM | POA: Diagnosis not present

## 2017-10-27 DIAGNOSIS — N368 Other specified disorders of urethra: Secondary | ICD-10-CM | POA: Diagnosis not present

## 2017-10-27 DIAGNOSIS — I428 Other cardiomyopathies: Secondary | ICD-10-CM | POA: Diagnosis not present

## 2017-10-27 DIAGNOSIS — I482 Chronic atrial fibrillation: Secondary | ICD-10-CM | POA: Diagnosis not present

## 2017-10-27 DIAGNOSIS — K219 Gastro-esophageal reflux disease without esophagitis: Secondary | ICD-10-CM | POA: Diagnosis not present

## 2017-10-27 DIAGNOSIS — F329 Major depressive disorder, single episode, unspecified: Secondary | ICD-10-CM | POA: Diagnosis not present

## 2017-10-27 DIAGNOSIS — Z01812 Encounter for preprocedural laboratory examination: Secondary | ICD-10-CM | POA: Diagnosis not present

## 2017-10-27 DIAGNOSIS — F0390 Unspecified dementia without behavioral disturbance: Secondary | ICD-10-CM | POA: Diagnosis not present

## 2017-10-27 DIAGNOSIS — Z87891 Personal history of nicotine dependence: Secondary | ICD-10-CM | POA: Diagnosis not present

## 2017-10-27 DIAGNOSIS — Z7901 Long term (current) use of anticoagulants: Secondary | ICD-10-CM | POA: Diagnosis not present

## 2017-10-27 DIAGNOSIS — A4189 Other specified sepsis: Secondary | ICD-10-CM | POA: Diagnosis not present

## 2017-10-27 DIAGNOSIS — Z888 Allergy status to other drugs, medicaments and biological substances status: Secondary | ICD-10-CM | POA: Diagnosis not present

## 2017-10-27 DIAGNOSIS — I5022 Chronic systolic (congestive) heart failure: Secondary | ICD-10-CM | POA: Diagnosis not present

## 2017-10-27 DIAGNOSIS — E873 Alkalosis: Secondary | ICD-10-CM | POA: Diagnosis not present

## 2017-10-27 DIAGNOSIS — R2689 Other abnormalities of gait and mobility: Secondary | ICD-10-CM | POA: Diagnosis not present

## 2017-10-27 DIAGNOSIS — E039 Hypothyroidism, unspecified: Secondary | ICD-10-CM | POA: Diagnosis not present

## 2017-10-27 DIAGNOSIS — Z01810 Encounter for preprocedural cardiovascular examination: Secondary | ICD-10-CM | POA: Diagnosis present

## 2017-10-27 DIAGNOSIS — I509 Heart failure, unspecified: Secondary | ICD-10-CM | POA: Diagnosis not present

## 2017-10-27 DIAGNOSIS — F0280 Dementia in other diseases classified elsewhere without behavioral disturbance: Secondary | ICD-10-CM | POA: Diagnosis not present

## 2017-10-27 DIAGNOSIS — Z8673 Personal history of transient ischemic attack (TIA), and cerebral infarction without residual deficits: Secondary | ICD-10-CM | POA: Diagnosis not present

## 2017-10-27 DIAGNOSIS — R339 Retention of urine, unspecified: Secondary | ICD-10-CM | POA: Diagnosis not present

## 2017-10-27 DIAGNOSIS — R9431 Abnormal electrocardiogram [ECG] [EKG]: Secondary | ICD-10-CM | POA: Diagnosis not present

## 2017-10-27 DIAGNOSIS — J168 Pneumonia due to other specified infectious organisms: Secondary | ICD-10-CM | POA: Diagnosis not present

## 2017-10-27 DIAGNOSIS — E119 Type 2 diabetes mellitus without complications: Secondary | ICD-10-CM | POA: Diagnosis not present

## 2017-10-27 DIAGNOSIS — Z7401 Bed confinement status: Secondary | ICD-10-CM | POA: Diagnosis not present

## 2017-10-27 DIAGNOSIS — R279 Unspecified lack of coordination: Secondary | ICD-10-CM | POA: Diagnosis not present

## 2017-10-27 DIAGNOSIS — I272 Pulmonary hypertension, unspecified: Secondary | ICD-10-CM | POA: Diagnosis not present

## 2017-10-27 DIAGNOSIS — I2510 Atherosclerotic heart disease of native coronary artery without angina pectoris: Secondary | ICD-10-CM | POA: Diagnosis not present

## 2017-10-27 DIAGNOSIS — M6281 Muscle weakness (generalized): Secondary | ICD-10-CM | POA: Diagnosis not present

## 2017-10-27 DIAGNOSIS — I110 Hypertensive heart disease with heart failure: Secondary | ICD-10-CM | POA: Diagnosis not present

## 2017-10-27 DIAGNOSIS — J180 Bronchopneumonia, unspecified organism: Secondary | ICD-10-CM | POA: Diagnosis not present

## 2017-10-27 DIAGNOSIS — Z950 Presence of cardiac pacemaker: Secondary | ICD-10-CM | POA: Diagnosis not present

## 2017-10-27 DIAGNOSIS — I5032 Chronic diastolic (congestive) heart failure: Secondary | ICD-10-CM | POA: Diagnosis not present

## 2017-10-27 DIAGNOSIS — R338 Other retention of urine: Secondary | ICD-10-CM | POA: Diagnosis not present

## 2017-10-27 DIAGNOSIS — I10 Essential (primary) hypertension: Secondary | ICD-10-CM | POA: Diagnosis not present

## 2017-10-27 DIAGNOSIS — Z91030 Bee allergy status: Secondary | ICD-10-CM | POA: Diagnosis not present

## 2017-10-27 DIAGNOSIS — N401 Enlarged prostate with lower urinary tract symptoms: Secondary | ICD-10-CM | POA: Diagnosis not present

## 2017-10-27 DIAGNOSIS — Z794 Long term (current) use of insulin: Secondary | ICD-10-CM | POA: Diagnosis not present

## 2017-10-27 DIAGNOSIS — N390 Urinary tract infection, site not specified: Secondary | ICD-10-CM | POA: Diagnosis not present

## 2017-10-28 DIAGNOSIS — N390 Urinary tract infection, site not specified: Secondary | ICD-10-CM | POA: Diagnosis not present

## 2017-10-28 DIAGNOSIS — F0280 Dementia in other diseases classified elsewhere without behavioral disturbance: Secondary | ICD-10-CM | POA: Diagnosis not present

## 2017-10-28 DIAGNOSIS — E860 Dehydration: Secondary | ICD-10-CM | POA: Diagnosis not present

## 2017-10-28 DIAGNOSIS — J168 Pneumonia due to other specified infectious organisms: Secondary | ICD-10-CM | POA: Diagnosis not present

## 2017-11-11 NOTE — Patient Instructions (Addendum)
Bradley Daugherty  11/11/2017     @PREFPERIOPPHARMACY @   Your procedure is scheduled on 11/21/2017.  Report to [facility] at 7:30 A.M.  Call this number if you have problems the morning of surgery:  [phone]   Remember:   DRINK 1 BOTTLE OF MAGNESIUM CITRATE AT NOON THE DAY BEFORE SURGERY AND FULL LIQUIDS THEREAFTER,  NO SOLID FOOD.  No food or drink after midnight.     Take these medicines the morning of surgery with A SIP OF WATER Albuterol inhaler, Flexeril if needed, Cardizem, Breo Ellipta, Imdur, Lisinopril, Claritin  Robaxin if needed, Metoprolol, Flomax, Ultram if needed    Do not wear jewelry, make-up or nail polish.  Do not wear lotions, powders, or perfumes, or deodorant.  Do not shave 48 hours prior to surgery.  Men may shave face and neck.  Do not bring valuables to the hospital.  [facility] is not responsible for any belongings or valuables.  Contacts, dentures or bridgework may not be worn into surgery.  Leave your suitcase in the car.  After surgery it may be brought to your room.  For patients admitted to the hospital, discharge time will be determined by your treatment team.  Patients discharged the day of surgery will not be allowed to drive home.   Purchase 1 bottle of Magnesium Citrate (Laxitive) and drink at 12 Noon the day prior to surgery.  Full liquids (fruit juice, pudding, plain ice cream, popsicles, broth) only, from that point on.   Please read over the following fact sheets that you were given. Surgical Site Infection Prevention and Anesthesia Post-op Instructions     PATIENT INSTRUCTIONS POST-ANESTHESIA  IMMEDIATELY FOLLOWING SURGERY:  Do not drive or operate machinery for the first twenty four hours after surgery.  Do not make any important decisions for twenty four hours after surgery or while taking narcotic pain medications or sedatives.  If you develop intractable nausea and vomiting or a severe headache please notify your doctor  immediately.  FOLLOW-UP:  Please make an appointment with your surgeon as instructed. You do not need to follow up with anesthesia unless specifically instructed to do so.  WOUND CARE INSTRUCTIONS (if applicable):  Keep a dry clean dressing on the anesthesia/puncture wound site if there is drainage.  Once the wound has quit draining you may leave it open to air.  Generally you should leave the bandage intact for twenty four hours unless there is drainage.  If the epidural site drains for more than 36-48 hours please call the anesthesia department.  QUESTIONS?:  Please feel free to call your physician or the hospital operator if you have any questions, and they will be happy to assist you.      Suprapubic Catheter Home Guide A suprapubic catheter is a rubber tube used to drain urine from the bladder into a collection bag. The catheter is inserted into the bladder through a small opening in the in the lower abdomen, near the center of the body, above the pubic bone (suprapubic area). There is a tiny balloon filled with germ-free (sterile) water on the end of the catheter that is in the bladder. The balloon helps to keep the catheter in place. Your suprapubic catheter may need to be replaced every 4-6 weeks, or as often as recommended by your health care provider. The collection bag must be emptied every day and cleaned every 2-3 days. The collection bag can be put beside your bed at night and attached to  your leg during the day. You may have a large collection bag to use at night and a smaller one to use during the day. What are the risks?  Urine flow can become blocked. This can happen if the catheter is not working correctly, or if you have a blood clot in your bladder or in the catheter.  Tissue near the catheter may can become irritated and bleed.  Bacteria may get into your bladder and cause a urinary tract infection. How do I change the catheter? Supplies needed  Two pairs of sterile  gloves.  Catheter.  Two syringes.  Sterile water.  Sterile cleaning solution.  Lubricant.  Collection bags. Changing the catheter To replace your catheter, take the following steps: 1. Drink plenty of fluids during the hours before you plan to change the catheter. 2. Wash your hands with soap and water. If soap and water are not available, use hand sanitizer. 3. Lie on your back and put on sterile gloves. 4. Clean the skin around the catheter opening using the sterile cleaning solution. 5. Remove the water from the balloon using a syringe. 6. Slowly remove the catheter. ? Do not pull on the catheter if it seems stuck. ? Call your health care provider immediately if you have difficulty removing the catheter. 7. Take off the used gloves, and put on a new pair. 8. Put lubricant on the end of the new catheter that will go into your bladder. 9. Gently slide the catheter through the opening in your abdomen and into your bladder. 10. Wait for some urine to start flowing through the catheter. When urine starts to flow through the catheter, use a new syringe to fill the balloon with sterile water. 11. Attach the collection bag to the end of the catheter. Make sure the connection is tight. 12. Remove the gloves and wash your hands with soap and water.  How do I care for my skin around the catheter? Use a clean washcloth and soapy water to clean the skin around your catheter every day. Pat the area dry with a clean towel.  Do not pull on the catheter.  Do not use ointment or lotion on this area unless told by your health care provider.  Check your skin around the catheter every day for signs of infection. Check for: ? Redness, swelling, or pain. ? Fluid or blood. ? Warmth. ? Pus or a bad smell.  How do I clean and empty the collection bag? Clean the collection bag every 2-3 days, or as often as told by your health care provider. To do this, take the following steps:  Wash your  hands with soap and water. If soap and water are not available, use hand sanitizer.  Disconnect the bag from the catheter and immediately attach a new bag to the catheter.  Empty the used bag completely.  Clean the used bag using one of the following methods: ? Rinse the used bag with warm water and soap. ? Fill the bag with water and add 1 tsp of vinegar. Let it sit for about 30 minutes, then empty the bag.  Let the bag dry completely, and put it in a clean plastic bag before storing it.  Empty the large collection bag every 8 hours. Empty the small collection bag when it is about ? full. To empty your large or small collection bag, take the following steps:  Always keep the bag below the level of the catheter. This keeps urine from flowing backwards  into the catheter.  Hold the bag over the toilet or another container. Turn the valve (spigot) at the bottom of the bag to empty the urine. ? Do not touch the opening of the spigot. ? Do not let the opening touch the toilet or container.  Close the spigot tightly when the bag is empty.  What are some general tips?  Always wash your hands before and after caring for your catheter and collection bag. Use a mild, fragrance-free soap. If soap and water are not available, use hand sanitizer.  Clean the catheter with soap and water as often as told by your health care provider.  Always make sure there are no twists or curls (kinks) in the catheter tube.  Always make sure there are no leaks in the catheter or collection bag.  Drink enough fluid to keep your urine clear or pale yellow.  Do not take baths, swim, or use a hot tub. When should I seek medical care? Seek medical care if:  You leak urine.  You have redness, swelling, or pain around your catheter opening.  You have fluid or blood coming from your catheter opening.  Your catheter opening feels warm to the touch.  You have pus or a bad smell coming from your catheter  opening.  You have a fever or chills.  Your urine flow slows down.  Your urine becomes cloudy or smelly.  When should I seek immediate medical care? Seek immediate medical care if your catheter comes out, or if you have:  Nausea.  Back pain.  Difficulty changing your catheter.  Blood in your urine.  No urine flow for 1 hour.  This information is not intended to replace advice given to you by your health care provider. Make sure you discuss any questions you have with your health care provider. Document Released: 02/23/2011 Document Revised: 02/04/2016 Document Reviewed: 02/18/2015 Elsevier Interactive Patient Education  2018 [employer].

## 2017-11-15 ENCOUNTER — Encounter (HOSPITAL_COMMUNITY)
Admission: RE | Admit: 2017-11-15 | Discharge: 2017-11-15 | Disposition: A | Discharge status: Home / Self Care | Payer: Medicare Other | Source: Ambulatory Visit | Attending: Urology | Admitting: Urology

## 2017-11-15 ENCOUNTER — Other Ambulatory Visit: Payer: Self-pay

## 2017-11-15 ENCOUNTER — Encounter (HOSPITAL_COMMUNITY): Payer: Self-pay

## 2017-11-15 DIAGNOSIS — Z01812 Encounter for preprocedural laboratory examination: Secondary | ICD-10-CM | POA: Diagnosis not present

## 2017-11-15 DIAGNOSIS — R9431 Abnormal electrocardiogram [ECG] [EKG]: Secondary | ICD-10-CM | POA: Diagnosis not present

## 2017-11-15 HISTORY — DX: Benign prostatic hyperplasia without lower urinary tract symptoms: N40.0

## 2017-11-15 HISTORY — DX: Cerebral infarction, unspecified: I63.9

## 2017-11-15 HISTORY — DX: Major depressive disorder, single episode, unspecified: F32.9

## 2017-11-15 HISTORY — DX: Unspecified atrial fibrillation: I48.91

## 2017-11-15 HISTORY — DX: Dysphagia, unspecified: R13.10

## 2017-11-15 HISTORY — DX: Gastro-esophageal reflux disease without esophagitis: K21.9

## 2017-11-15 HISTORY — DX: Depression, unspecified: F32.A

## 2017-11-15 LAB — CBC
HEMATOCRIT: 42.2 % (ref 39.0–52.0)
HEMOGLOBIN: 13.4 g/dL (ref 13.0–17.0)
MCH: 26.7 pg (ref 26.0–34.0)
MCHC: 31.8 g/dL (ref 30.0–36.0)
MCV: 84.1 fL (ref 78.0–100.0)
Platelets: 339 10*3/uL (ref 150–400)
RBC: 5.02 MIL/uL (ref 4.22–5.81)
RDW: 16.9 % — AB (ref 11.5–15.5)
WBC: 5.3 10*3/uL (ref 4.0–10.5)

## 2017-11-15 LAB — BASIC METABOLIC PANEL
Anion gap: 13 (ref 5–15)
BUN: 26 mg/dL — AB (ref 6–20)
CHLORIDE: 102 mmol/L (ref 101–111)
CO2: 25 mmol/L (ref 22–32)
CREATININE: 0.8 mg/dL (ref 0.61–1.24)
Calcium: 9.4 mg/dL (ref 8.9–10.3)
GFR calc Af Amer: >60 mL/min (ref >60)
GFR calc non Af Amer: >60 mL/min (ref >60)
Glucose, Bld: 108 mg/dL — ABNORMAL HIGH (ref 65–99)
Potassium: 3.7 mmol/L (ref 3.5–5.1)
Sodium: 140 mmol/L (ref 135–145)

## 2017-11-15 LAB — HEMOGLOBIN A1C
Hgb A1c MFr Bld: 6.4 % — ABNORMAL HIGH (ref 4.8–5.6)
Mean Plasma Glucose: 136.98 mg/dL

## 2017-11-15 MED ORDER — MAGNESIUM CITRATE PO SOLN
1.0000 | Freq: Once | ORAL | Status: DC
  Filled 2017-11-15: qty 296

## 2017-11-21 ENCOUNTER — Ambulatory Visit (HOSPITAL_COMMUNITY)
Admission: RE | Admit: 2017-11-21 | Discharge: 2017-11-21 | Disposition: A | Discharge status: Skilled Nursing Facility | Payer: Medicare Other | Source: Ambulatory Visit | Attending: Urology | Admitting: Urology

## 2017-11-21 ENCOUNTER — Ambulatory Visit (HOSPITAL_COMMUNITY): Payer: Medicare Other | Admitting: Anesthesiology

## 2017-11-21 ENCOUNTER — Encounter (HOSPITAL_COMMUNITY)
Admission: RE | Disposition: A | Discharge status: Skilled Nursing Facility | Payer: Self-pay | Source: Ambulatory Visit | Attending: Urology

## 2017-11-21 ENCOUNTER — Encounter (HOSPITAL_COMMUNITY): Payer: Self-pay

## 2017-11-21 DIAGNOSIS — K219 Gastro-esophageal reflux disease without esophagitis: Secondary | ICD-10-CM | POA: Diagnosis not present

## 2017-11-21 DIAGNOSIS — I482 Chronic atrial fibrillation: Secondary | ICD-10-CM | POA: Insufficient documentation

## 2017-11-21 DIAGNOSIS — R339 Retention of urine, unspecified: Secondary | ICD-10-CM | POA: Diagnosis not present

## 2017-11-21 DIAGNOSIS — Z91030 Bee allergy status: Secondary | ICD-10-CM | POA: Insufficient documentation

## 2017-11-21 DIAGNOSIS — I2510 Atherosclerotic heart disease of native coronary artery without angina pectoris: Secondary | ICD-10-CM | POA: Diagnosis not present

## 2017-11-21 DIAGNOSIS — I428 Other cardiomyopathies: Secondary | ICD-10-CM | POA: Insufficient documentation

## 2017-11-21 DIAGNOSIS — Z950 Presence of cardiac pacemaker: Secondary | ICD-10-CM | POA: Insufficient documentation

## 2017-11-21 DIAGNOSIS — Z888 Allergy status to other drugs, medicaments and biological substances status: Secondary | ICD-10-CM | POA: Insufficient documentation

## 2017-11-21 DIAGNOSIS — Z794 Long term (current) use of insulin: Secondary | ICD-10-CM | POA: Insufficient documentation

## 2017-11-21 DIAGNOSIS — I272 Pulmonary hypertension, unspecified: Secondary | ICD-10-CM | POA: Diagnosis not present

## 2017-11-21 DIAGNOSIS — I10 Essential (primary) hypertension: Secondary | ICD-10-CM | POA: Diagnosis not present

## 2017-11-21 DIAGNOSIS — Z8673 Personal history of transient ischemic attack (TIA), and cerebral infarction without residual deficits: Secondary | ICD-10-CM | POA: Diagnosis not present

## 2017-11-21 DIAGNOSIS — N368 Other specified disorders of urethra: Secondary | ICD-10-CM | POA: Insufficient documentation

## 2017-11-21 DIAGNOSIS — N401 Enlarged prostate with lower urinary tract symptoms: Secondary | ICD-10-CM | POA: Insufficient documentation

## 2017-11-21 DIAGNOSIS — I5022 Chronic systolic (congestive) heart failure: Secondary | ICD-10-CM | POA: Insufficient documentation

## 2017-11-21 DIAGNOSIS — Z7901 Long term (current) use of anticoagulants: Secondary | ICD-10-CM | POA: Insufficient documentation

## 2017-11-21 DIAGNOSIS — R338 Other retention of urine: Secondary | ICD-10-CM | POA: Insufficient documentation

## 2017-11-21 DIAGNOSIS — E119 Type 2 diabetes mellitus without complications: Secondary | ICD-10-CM | POA: Insufficient documentation

## 2017-11-21 DIAGNOSIS — Z87891 Personal history of nicotine dependence: Secondary | ICD-10-CM | POA: Insufficient documentation

## 2017-11-21 HISTORY — PX: INSERTION OF SUPRAPUBIC CATHETER: SHX5870

## 2017-11-21 LAB — GLUCOSE, CAPILLARY: Glucose-Capillary: 88 mg/dL (ref 65–99)

## 2017-11-21 SURGERY — INSERTION, SUPRAPUBIC CATHETER
Anesthesia: General | Site: Abdomen

## 2017-11-21 MED ORDER — HYDROCODONE-ACETAMINOPHEN 7.5-325 MG PO TABS: 1.0000 | ORAL_TABLET | Freq: Once | ORAL | Status: DC | PRN

## 2017-11-21 MED ORDER — HYDROMORPHONE HCL 1 MG/ML IJ SOLN: 0.2500 mg | INTRAMUSCULAR | Status: DC | PRN

## 2017-11-21 MED ORDER — CEFAZOLIN SODIUM-DEXTROSE 2-4 GM/100ML-% IV SOLN
INTRAVENOUS | Status: AC
  Filled 2017-11-21: qty 100

## 2017-11-21 MED ORDER — PROMETHAZINE HCL 25 MG/ML IJ SOLN: 6.2500 mg | INTRAMUSCULAR | Status: DC | PRN

## 2017-11-21 MED ORDER — LIDOCAINE HCL (PF) 1 % IJ SOLN
INTRAMUSCULAR | Status: DC | PRN
  Administered 2017-11-21: 10 mL

## 2017-11-21 MED ORDER — MEPERIDINE HCL 50 MG/ML IJ SOLN: 6.2500 mg | INTRAMUSCULAR | Status: DC | PRN

## 2017-11-21 MED ORDER — OXYCODONE-ACETAMINOPHEN 5-325 MG PO TABS: 1.0000 | ORAL_TABLET | ORAL | 0 refills | Status: AC | PRN

## 2017-11-21 MED ORDER — ONDANSETRON HCL 4 MG/2ML IJ SOLN
INTRAMUSCULAR | Status: DC | PRN
  Administered 2017-11-21: 4 mg via INTRAVENOUS

## 2017-11-21 MED ORDER — MIDAZOLAM HCL 5 MG/5ML IJ SOLN
INTRAMUSCULAR | Status: DC | PRN
  Administered 2017-11-21: .5 mg via INTRAVENOUS

## 2017-11-21 MED ORDER — LACTATED RINGERS IV SOLN
INTRAVENOUS | Status: DC
  Administered 2017-11-21: 08:00:00 via INTRAVENOUS

## 2017-11-21 MED ORDER — FENTANYL CITRATE (PF) 100 MCG/2ML IJ SOLN: INTRAMUSCULAR | Status: DC | PRN

## 2017-11-21 MED ORDER — STERILE WATER FOR IRRIGATION IR SOLN
Status: DC | PRN
  Administered 2017-11-21: 3000 mL
  Administered 2017-11-21: 500 mL

## 2017-11-21 MED ORDER — LIDOCAINE HCL (CARDIAC) PF 100 MG/5ML IV SOSY
PREFILLED_SYRINGE | INTRAVENOUS | Status: DC | PRN
  Administered 2017-11-21: 40 mg via INTRAVENOUS

## 2017-11-21 MED ORDER — FENTANYL CITRATE (PF) 100 MCG/2ML IJ SOLN
INTRAMUSCULAR | Status: DC | PRN
  Administered 2017-11-21 (×2): 25 ug via INTRAVENOUS

## 2017-11-21 MED ORDER — PROPOFOL 10 MG/ML IV BOLUS
INTRAVENOUS | Status: DC | PRN
  Administered 2017-11-21: 100 mg via INTRAVENOUS

## 2017-11-21 MED ORDER — PHENYLEPHRINE HCL 10 MG/ML IJ SOLN
INTRAMUSCULAR | Status: DC | PRN
  Administered 2017-11-21 (×4): 80 ug via INTRAVENOUS

## 2017-11-21 MED ORDER — CEFAZOLIN SODIUM-DEXTROSE 2-4 GM/100ML-% IV SOLN
2.0000 g | INTRAVENOUS | Status: AC
  Administered 2017-11-21: 2 g via INTRAVENOUS

## 2017-11-21 MED ORDER — LACTATED RINGERS IV SOLN: INTRAVENOUS | Status: DC

## 2017-11-21 MED ORDER — LIDOCAINE HCL (PF) 1 % IJ SOLN
INTRAMUSCULAR | Status: AC
  Filled 2017-11-21: qty 30

## 2017-11-21 SURGICAL SUPPLY — 33 items
BAG DRAIN URO TABLE W/ADPT NS (DRAPE) ×3 IMPLANT
BAG DRN 8 ADPR NS SKTRN CSTL (DRAPE) ×1
BAG URINE DRAINAGE (UROLOGICAL SUPPLIES) ×4 IMPLANT
BLADE SURG 15 STRL LF DISP TIS (BLADE) ×1 IMPLANT
BLADE SURG 15 STRL SS (BLADE) ×3
CATH FOLEY 2WAY SLVR  5CC 16FR (CATHETERS)
CATH FOLEY 2WAY SLVR  5CC 18FR (CATHETERS) ×4
CATH FOLEY 2WAY SLVR 5CC 16FR (CATHETERS) IMPLANT
CATH FOLEY 2WAY SLVR 5CC 18FR (CATHETERS) ×2 IMPLANT
CLOTH BEACON ORANGE TIMEOUT ST (SAFETY) ×3 IMPLANT
COVER LIGHT HANDLE STERIS (MISCELLANEOUS) ×3 IMPLANT
DECANTER SPIKE VIAL GLASS SM (MISCELLANEOUS) ×3 IMPLANT
ELECT REM PT RETURN 9FT ADLT (ELECTROSURGICAL) ×3
ELECTRODE REM PT RTRN 9FT ADLT (ELECTROSURGICAL) ×1 IMPLANT
GLOVE BIO SURGEON STRL SZ8 (GLOVE) ×3 IMPLANT
GOWN STRL REUS W/ TWL XL LVL3 (GOWN DISPOSABLE) ×1 IMPLANT
GOWN STRL REUS W/TWL LRG LVL3 (GOWN DISPOSABLE) ×3 IMPLANT
GOWN STRL REUS W/TWL XL LVL3 (GOWN DISPOSABLE) ×3
INTRODUCER SUPRAPUBIC 18FR (INTRODUCER) ×3 IMPLANT
KIT BLADEGUARD II DBL (SET/KITS/TRAYS/PACK) ×3 IMPLANT
KIT SURGICAL DEVON (SET/KITS/TRAYS/PACK) ×3 IMPLANT
KIT TURNOVER CYSTO (KITS) ×3 IMPLANT
MANIFOLD NEPTUNE II (INSTRUMENTS) ×2 IMPLANT
NDL HYPO 25X1 1.5 SAFETY (NEEDLE) ×1 IMPLANT
NEEDLE HYPO 25X1 1.5 SAFETY (NEEDLE) ×3 IMPLANT
PACK CYSTO (CUSTOM PROCEDURE TRAY) ×3 IMPLANT
PAD ARMBOARD 7.5X6 YLW CONV (MISCELLANEOUS) ×3 IMPLANT
SPONGE DRAIN TRACH 4X4 STRL 2S (GAUZE/BANDAGES/DRESSINGS) ×2 IMPLANT
SUT SILK 0 FSL (SUTURE) ×2 IMPLANT
SYR CONTROL 10ML LL (SYRINGE) ×3 IMPLANT
TAPE PAPER 2X10 WHT MICROPORE (GAUZE/BANDAGES/DRESSINGS) ×2 IMPLANT
WATER STERILE IRR 3000ML UROMA (IV SOLUTION) ×3 IMPLANT
WATER STERILE IRR 500ML POUR (IV SOLUTION) ×6 IMPLANT

## 2017-11-21 NOTE — H&P (Signed)
Urology Admission H&P  Chief Complaint: urinary retention  History of Present Illness: Bradley Daugherty is a 72yo with a hx of BPH and CVA with urinary retention managed with an indwelling foley. He has had a urethral erosion and nursing has had progressive difficulty changing his indwelling foley. He wishes SP tube  Past Medical History:  Diagnosis Date  . Atrial fibrillation (HCC)   . BPH (benign prostatic hyperplasia)   . CAD (coronary artery disease) 2002   Nonobstructive  . Chronic atrial fibrillation (HCC)    Chronic anticoagulation therapy, followed at the [facility] clinic in [place], [place]  . Chronic systolic heart failure (HCC)   . Depression   . Diabetes mellitus, insulin dependent (IDDM), controlled (HCC)   . Dysphagia   . GERD (gastroesophageal reflux disease)   . Hypercholesterolemia   . Lower GI bleed    negative colonoscopy  . Moderate mitral regurgitation   . Nonischemic cardiomyopathy (HCC)    Presumed. Ejection fraction 30-35% August 2013  . Pacemaker -Medtronic   . Pulmonary hypertension (HCC)   . Stroke [facility])    Past Surgical History:  Procedure Laterality Date  . COLONOSCOPY    . HERNIA REPAIR    . PACEMAKER PLACEMENT      Home Medications:  No current facility-administered medications for this encounter.    Allergies:  Allergies  Allergen Reactions  . Bee Venom     On MAR  . Gabapentin     REACTION: myalgia  . Simvastatin     REACTION: myalgia    History reviewed. No pertinent family history. Social History:  reports that he quit smoking about 23 years ago. His smoking use included cigarettes. He has a 17.00 pack-year smoking history. He has never used smokeless tobacco. He reports that he does not drink alcohol or use drugs.  Review of Systems  All other systems reviewed and are negative.   Physical Exam:  Vital signs in last 24 hours: Temp:  [97.9 F (36.6 C)] 97.9 F (36.6 C) (06/03 0737) Pulse Rate:  [70] 70 (06/03 0737) Resp:  [18] 18 (06/03  0737) BP: (121)/(66) 121/66 (06/03 0737) SpO2:  [95 %] 95 % (06/03 0737) Physical Exam  Constitutional: He is oriented to person, place, and time. He appears well-developed and well-nourished.  HENT:  Head: Normocephalic and atraumatic.  Eyes: Pupils are equal, round, and reactive to light. EOM are normal.  Neck: Normal range of motion. No thyromegaly present.  Cardiovascular: Normal rate and regular rhythm.  Respiratory: Effort normal. No respiratory distress.  GI: Soft. He exhibits no distension.  Musculoskeletal: Normal range of motion. He exhibits no edema.  Neurological: He is alert and oriented to person, place, and time.  Skin: Skin is warm and dry.  Psychiatric: He has a normal mood and affect. His behavior is normal. Judgment and thought content normal.    Laboratory Data:  Results for orders placed or performed during the hospital encounter of 11/21/17 (from the past 24 hour(s))  Glucose, capillary     Status: None   Collection Time: 11/21/17  7:28 AM  Result Value Ref Range   Glucose-Capillary 88 65 - 99 mg/dL   No results found for this or any previous visit (from the past 240 hour(s)). Creatinine: Recent Labs    11/15/17 0803  CREATININE 0.80   Baseline Creatinine: 0.8  Impression/Assessment:  71yo with urinary retention  Plan:  The risks/benefits/alternatives to SP tube placement was explained to the patient and he understands and wishes to  proceed with surgery  Wilkie Aye 11/21/2017, 7:40 AM

## 2017-11-21 NOTE — Anesthesia Postprocedure Evaluation (Signed)
Anesthesia Post Note  Patient: Bradley Daugherty  Procedure(s) Performed: INSERTION OF SUPRAPUBIC CATHETER (N/A Abdomen)  Patient location during evaluation: PACU Anesthesia Type: General Level of consciousness: awake and alert and oriented Pain management: pain level controlled Vital Signs Assessment: post-procedure vital signs reviewed and stable Respiratory status: spontaneous breathing Cardiovascular status: stable Postop Assessment: no apparent nausea or vomiting Anesthetic complications: no     Last Vitals:  Vitals:   11/21/17 0915 11/21/17 0930  BP: 97/63 99/64  Pulse: 71 67  Resp: 14 13  Temp:    SpO2: 100% 100%    Last Pain:  Vitals:   11/21/17 0930  TempSrc:   PainSc: 0-No pain                 Javonn Gauger A

## 2017-11-21 NOTE — Anesthesia Preprocedure Evaluation (Signed)
Anesthesia Evaluation  Patient identified by MRN, date of birth, ID band Patient awake    Reviewed: Allergy & Precautions, H&P , NPO status , Patient's Chart, lab work & pertinent test results, reviewed documented beta blocker date and time   Airway Mallampati: II  TM Distance: >3 FB Neck ROM: full    Dental no notable dental hx. (+) Edentulous Upper, Edentulous Lower   Pulmonary neg pulmonary ROS, former smoker,    Pulmonary exam normal breath sounds clear to auscultation + decreased breath sounds      Cardiovascular Exercise Tolerance: Good hypertension, Pt. 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 Daughter present, good historian  Reproductive/Obstetrics negative OB ROS                             Anesthesia Physical Anesthesia Plan  ASA: IV  Anesthesia Plan: General   Post-op Pain Management:    Induction:   PONV Risk Score and Plan:   Airway Management Planned:   Additional Equipment:   Intra-op Plan:   Post-operative Plan:   Informed Consent: I have reviewed the patients History and Physical, chart, labs and discussed the procedure including the risks, benefits and alternatives for the proposed anesthesia with the patient or authorized representative who has indicated his/her understanding and acceptance.   Dental Advisory Given  Plan Discussed with: CRNA and  Anesthesiologist  Anesthesia Plan Comments:         Anesthesia Quick Evaluation

## 2017-11-21 NOTE — Transfer of Care (Signed)
Immediate Anesthesia Transfer of Care Note  Patient: Bradley Daugherty  Procedure(s) Performed: INSERTION OF SUPRAPUBIC CATHETER (N/A Abdomen)  Patient Location: PACU  Anesthesia Type:General  Level of Consciousness: awake, alert  and oriented  Airway & Oxygen Therapy: Patient Spontanous Breathing and Patient connected to nasal cannula oxygen  Post-op Assessment: Report given to RN and Post -op Vital signs reviewed and stable  Post vital signs: Reviewed and stable  Last Vitals:  Vitals Value Taken Time  BP 107/47 11/21/2017  9:08 AM  Temp    Pulse 80 11/21/2017  9:09 AM  Resp 17 11/21/2017  9:09 AM  SpO2 100 % 11/21/2017  9:09 AM  Vitals shown include unvalidated device data.  Last Pain:  Vitals:   11/21/17 0737  TempSrc: Oral  PainSc: 0-No pain         Complications: No apparent anesthesia complications

## 2017-11-21 NOTE — Anesthesia Procedure Notes (Signed)
Procedure Name: LMA Insertion Date/Time: 11/21/2017 8:04 AM Performed by: Pernell Dupre, Berl Bonfanti A, CRNA Pre-anesthesia Checklist: Patient identified, Timeout performed, Emergency Drugs available, Suction available and Patient being monitored Patient Re-evaluated:Patient Re-evaluated prior to induction Oxygen Delivery Method: Circle system utilized Preoxygenation: Pre-oxygenation with 100% oxygen Induction Type: IV induction Ventilation: Mask ventilation without difficulty LMA: LMA inserted LMA Size: 5.0 Number of attempts: 1 Placement Confirmation: CO2 detector and breath sounds checked- equal and bilateral Tube secured with: Tape

## 2017-11-21 NOTE — Discharge Instructions (Signed)
Suprapubic Catheter Replacement, Care After  Refer to this sheet in the next few weeks. These instructions provide you with information about caring for yourself after your procedure. Your health care provider may also give you more specific instructions. Your treatment has been planned according to current medical practices, but problems sometimes occur. Call your health care provider if you have any problems or questions after your procedure.  What can I expect after the procedure?  After your procedure, it is possible to have some discomfort around the opening in your abdomen.  Follow these instructions at home:  Caring for your skin around the catheter  Use a clean washcloth and soapy water to clean the skin around your catheter every day. Pat the area dry with a clean towel.   Do not pull on the catheter.   Do not use ointment or lotion on this area unless told by your health care provider.   Check your skin around the catheter every day for signs of infection. Check for:  ? Redness, swelling, or pain.  ? Fluid or blood.  ? Warmth.  ? Pus or a bad smell.    Caring for the catheter tube   Clean the catheter tube with soap and water as often as told by your health care provider.   Always make sure there are no twists or curls (kinks) in the catheter tube.  Emptying the collection bag  Empty the large collection bag every 8 hours. Empty the small collection bag when it is about ? full. To empty your large or small collection bag, take the following steps:   Always keep the bag below the level of the catheter. This keeps urine from flowing backwards into the catheter.   Hold the bag over the toilet or another container. Turn the valve (spigot) at the bottom of the bag to empty the urine.  ? Do not touch the opening of the spigot.  ? Do not let the opening touch the toilet or container.   Close the spigot tightly when the bag is empty.    Cleaning the collection bag    Clean the collection bag every 2-3  days, or as often as told by your health care provider. To do this, take the following steps:   Wash your hands with soap and water. If soap and water are not available, use hand sanitizer.   Disconnect the bag from the catheter and immediately attach a new bag to the catheter.   Empty the used bag completely.   Clean the used bag using one of the following methods:  ? Rinse the bag with warm water and soap.  ? Fill the bag with water and add 1 tsp of vinegar. Let it sit for about 30 minutes, then empty the bag.   Let the bag dry completely, and put it in a clean plastic bag before storing it.    General instructions   Always wash your hands before and after caring for your catheter and collection bag. Use a mild, fragrance-free soap. If soap and water are not available, use hand sanitizer.   Always make sure there are no leaks in the catheter or collection bag.   Drink enough fluid to keep your urine clear or pale yellow.   If you were prescribed an antibiotic medicine, take it as told by your health care provider. Do not stop taking the antibiotic even if you start to feel better.   Do not take baths, swim, or use   a hot tub.   Keep all follow-up appointments as told by your health care provider. This is important.  Contact a health care provider if:   You leak urine.   You have redness, swelling, or pain around your catheter opening.   You have fluid or blood coming from your catheter opening.   Your catheter opening feels warm to the touch.   You have pus or a bad smell coming from your catheter opening.   You have a fever or chills.   Your urine flow slows down.   Your urine becomes cloudy or smelly.  Get help right away if:   Your catheter comes out.   You feel nauseous.   You have back pain.   You have difficulty changing your catheter.   You have blood in your urine.   You have no urine flow for 1 hour.  This information is not intended to replace advice given to you by your health  care provider. Make sure you discuss any questions you have with your health care provider.  Document Released: 02/23/2011 Document Revised: 02/04/2016 Document Reviewed: 02/18/2015  [employer]vier Interactive Patient Education  2018 [employer]

## 2017-11-21 NOTE — Op Note (Signed)
Preoperative diagnosis: BPH, urinary retention  Postop diagnosis: Same  Procedure: 1. Cystoscopy 2. Suprapubic tube placement  Attending: Wilkie Aye  Anesthesia: General  Estimated blood loss: 5 cc  Drains: 1. 18 French Foley catheter 2. 18 French SP tube  Specimens: none  Antibiotics: ancef  Findings:  bilobar hypertrophy. Severe bladder trabeculations. No masses/lesions in the bladder.  Indications: Patient is a 72 year old with a history of BPH and urinary retention requiring chronic foley catheter. The patient wishes to proceed with SP tube placement..  After discussing treatment options patient decided to proceed with SP tube placement.  Procedure in detail: Prior to procedure consetn was obtained. Patient was brought to the operating room and briefing was done sure correct patient, correct procedure, correct site. General anesthesia was in administered patient was placed in the dorsal lithotomy position. The rigid 22 French cystoscope was passed urethra and bladder. Bladder was inspected masses or lesions and none were found. We then filled the bladder to approximately 500cc until it was easily palpable on the abdominal wall. We then made a 1cm incision 2cm above the pubic symphysis. Using the [facility] suprapubic tube introducer through the suprapubic incision we entered the bladder at the dome under direct vision. We removed the inner cannula and placed a 18 french foley through the sheath and into the bladder. We then filled the balloon with 10cc of water. The sheath was then removed and the SP tube was secured with a 0 silk to the skin. bladder was then drained and a 16 French Foley catheter was placed. This concluded the procedure which resulted by the patient.  Complications: None  Condition: Stable, extubated, transferred to PACU.  Plan: Patient is to be discharged home. He is to followup in 1 week for foley catheter removal

## 2017-11-22 ENCOUNTER — Encounter (HOSPITAL_COMMUNITY): Payer: Self-pay | Admitting: Urology

## 2017-11-30 ENCOUNTER — Ambulatory Visit (INDEPENDENT_AMBULATORY_CARE_PROVIDER_SITE_OTHER): Payer: Medicare Other | Admitting: Urology

## 2017-11-30 DIAGNOSIS — R338 Other retention of urine: Secondary | ICD-10-CM

## 2017-12-02 DIAGNOSIS — F0280 Dementia in other diseases classified elsewhere without behavioral disturbance: Secondary | ICD-10-CM | POA: Diagnosis not present

## 2017-12-02 DIAGNOSIS — E860 Dehydration: Secondary | ICD-10-CM | POA: Diagnosis not present

## 2017-12-02 DIAGNOSIS — N390 Urinary tract infection, site not specified: Secondary | ICD-10-CM | POA: Diagnosis not present

## 2017-12-02 DIAGNOSIS — J168 Pneumonia due to other specified infectious organisms: Secondary | ICD-10-CM | POA: Diagnosis not present

## 2017-12-19 DIAGNOSIS — N390 Urinary tract infection, site not specified: Secondary | ICD-10-CM | POA: Diagnosis not present

## 2017-12-19 DIAGNOSIS — E860 Dehydration: Secondary | ICD-10-CM | POA: Diagnosis not present

## 2018-01-04 ENCOUNTER — Ambulatory Visit (INDEPENDENT_AMBULATORY_CARE_PROVIDER_SITE_OTHER): Payer: Medicare Other | Admitting: Urology

## 2018-01-04 DIAGNOSIS — R338 Other retention of urine: Secondary | ICD-10-CM

## 2018-01-07 DIAGNOSIS — N390 Urinary tract infection, site not specified: Secondary | ICD-10-CM | POA: Diagnosis not present

## 2018-01-07 DIAGNOSIS — E860 Dehydration: Secondary | ICD-10-CM | POA: Diagnosis not present

## 2018-01-07 DIAGNOSIS — J168 Pneumonia due to other specified infectious organisms: Secondary | ICD-10-CM | POA: Diagnosis not present

## 2018-01-07 DIAGNOSIS — F0280 Dementia in other diseases classified elsewhere without behavioral disturbance: Secondary | ICD-10-CM | POA: Diagnosis not present

## 2018-02-15 DIAGNOSIS — H40023 Open angle with borderline findings, high risk, bilateral: Secondary | ICD-10-CM | POA: Diagnosis not present

## 2018-02-15 DIAGNOSIS — Z7984 Long term (current) use of oral hypoglycemic drugs: Secondary | ICD-10-CM | POA: Diagnosis not present

## 2018-02-15 DIAGNOSIS — E119 Type 2 diabetes mellitus without complications: Secondary | ICD-10-CM | POA: Diagnosis not present

## 2018-02-15 DIAGNOSIS — H2513 Age-related nuclear cataract, bilateral: Secondary | ICD-10-CM | POA: Diagnosis not present

## 2018-02-23 DIAGNOSIS — B351 Tinea unguium: Secondary | ICD-10-CM | POA: Diagnosis not present

## 2018-02-23 DIAGNOSIS — E1151 Type 2 diabetes mellitus with diabetic peripheral angiopathy without gangrene: Secondary | ICD-10-CM | POA: Diagnosis not present

## 2018-02-24 DIAGNOSIS — F0280 Dementia in other diseases classified elsewhere without behavioral disturbance: Secondary | ICD-10-CM | POA: Diagnosis not present

## 2018-02-24 DIAGNOSIS — E860 Dehydration: Secondary | ICD-10-CM | POA: Diagnosis not present

## 2018-02-24 DIAGNOSIS — J168 Pneumonia due to other specified infectious organisms: Secondary | ICD-10-CM | POA: Diagnosis not present

## 2018-02-24 DIAGNOSIS — N390 Urinary tract infection, site not specified: Secondary | ICD-10-CM | POA: Diagnosis not present

## 2018-03-21 DIAGNOSIS — Z23 Encounter for immunization: Secondary | ICD-10-CM | POA: Diagnosis not present

## 2018-04-06 DIAGNOSIS — F0280 Dementia in other diseases classified elsewhere without behavioral disturbance: Secondary | ICD-10-CM | POA: Diagnosis not present

## 2018-04-06 DIAGNOSIS — J168 Pneumonia due to other specified infectious organisms: Secondary | ICD-10-CM | POA: Diagnosis not present

## 2018-04-06 DIAGNOSIS — E860 Dehydration: Secondary | ICD-10-CM | POA: Diagnosis not present

## 2018-04-06 DIAGNOSIS — N390 Urinary tract infection, site not specified: Secondary | ICD-10-CM | POA: Diagnosis not present

## 2018-04-11 DIAGNOSIS — R05 Cough: Secondary | ICD-10-CM | POA: Diagnosis not present

## 2018-04-12 DIAGNOSIS — I509 Heart failure, unspecified: Secondary | ICD-10-CM | POA: Diagnosis not present

## 2018-05-18 DIAGNOSIS — J168 Pneumonia due to other specified infectious organisms: Secondary | ICD-10-CM | POA: Diagnosis not present

## 2018-05-18 DIAGNOSIS — E860 Dehydration: Secondary | ICD-10-CM | POA: Diagnosis not present

## 2018-05-18 DIAGNOSIS — F0280 Dementia in other diseases classified elsewhere without behavioral disturbance: Secondary | ICD-10-CM | POA: Diagnosis not present

## 2018-05-18 DIAGNOSIS — N390 Urinary tract infection, site not specified: Secondary | ICD-10-CM | POA: Diagnosis not present

## 2018-06-23 DIAGNOSIS — E119 Type 2 diabetes mellitus without complications: Secondary | ICD-10-CM | POA: Diagnosis present

## 2018-06-23 DIAGNOSIS — M6281 Muscle weakness (generalized): Secondary | ICD-10-CM | POA: Diagnosis not present

## 2018-06-23 DIAGNOSIS — R197 Diarrhea, unspecified: Secondary | ICD-10-CM | POA: Diagnosis not present

## 2018-06-23 DIAGNOSIS — Z87891 Personal history of nicotine dependence: Secondary | ICD-10-CM | POA: Diagnosis not present

## 2018-06-23 DIAGNOSIS — Z681 Body mass index (BMI) 19 or less, adult: Secondary | ICD-10-CM | POA: Diagnosis not present

## 2018-06-23 DIAGNOSIS — Z7984 Long term (current) use of oral hypoglycemic drugs: Secondary | ICD-10-CM | POA: Diagnosis not present

## 2018-06-23 DIAGNOSIS — E440 Moderate protein-calorie malnutrition: Secondary | ICD-10-CM | POA: Diagnosis present

## 2018-06-23 DIAGNOSIS — E039 Hypothyroidism, unspecified: Secondary | ICD-10-CM | POA: Diagnosis present

## 2018-06-23 DIAGNOSIS — R404 Transient alteration of awareness: Secondary | ICD-10-CM | POA: Diagnosis not present

## 2018-06-23 DIAGNOSIS — F329 Major depressive disorder, single episode, unspecified: Secondary | ICD-10-CM | POA: Diagnosis present

## 2018-06-23 DIAGNOSIS — I482 Chronic atrial fibrillation, unspecified: Secondary | ICD-10-CM | POA: Diagnosis present

## 2018-06-23 DIAGNOSIS — R2689 Other abnormalities of gait and mobility: Secondary | ICD-10-CM | POA: Diagnosis not present

## 2018-06-23 DIAGNOSIS — A419 Sepsis, unspecified organism: Secondary | ICD-10-CM | POA: Diagnosis not present

## 2018-06-23 DIAGNOSIS — E7800 Pure hypercholesterolemia, unspecified: Secondary | ICD-10-CM | POA: Diagnosis present

## 2018-06-23 DIAGNOSIS — I69351 Hemiplegia and hemiparesis following cerebral infarction affecting right dominant side: Secondary | ICD-10-CM | POA: Diagnosis not present

## 2018-06-23 DIAGNOSIS — Z936 Other artificial openings of urinary tract status: Secondary | ICD-10-CM | POA: Diagnosis not present

## 2018-06-23 DIAGNOSIS — R0902 Hypoxemia: Secondary | ICD-10-CM | POA: Diagnosis not present

## 2018-06-23 DIAGNOSIS — Z7401 Bed confinement status: Secondary | ICD-10-CM | POA: Diagnosis not present

## 2018-06-23 DIAGNOSIS — Z7982 Long term (current) use of aspirin: Secondary | ICD-10-CM | POA: Diagnosis not present

## 2018-06-23 DIAGNOSIS — K8020 Calculus of gallbladder without cholecystitis without obstruction: Secondary | ICD-10-CM | POA: Diagnosis not present

## 2018-06-23 DIAGNOSIS — N400 Enlarged prostate without lower urinary tract symptoms: Secondary | ICD-10-CM | POA: Diagnosis present

## 2018-06-23 DIAGNOSIS — R29898 Other symptoms and signs involving the musculoskeletal system: Secondary | ICD-10-CM | POA: Diagnosis not present

## 2018-06-23 DIAGNOSIS — Z79899 Other long term (current) drug therapy: Secondary | ICD-10-CM | POA: Diagnosis not present

## 2018-06-23 DIAGNOSIS — N390 Urinary tract infection, site not specified: Secondary | ICD-10-CM | POA: Diagnosis not present

## 2018-06-23 DIAGNOSIS — J181 Lobar pneumonia, unspecified organism: Secondary | ICD-10-CM | POA: Diagnosis not present

## 2018-06-23 DIAGNOSIS — I110 Hypertensive heart disease with heart failure: Secondary | ICD-10-CM | POA: Diagnosis present

## 2018-06-23 DIAGNOSIS — K219 Gastro-esophageal reflux disease without esophagitis: Secondary | ICD-10-CM | POA: Diagnosis present

## 2018-06-23 DIAGNOSIS — T83518A Infection and inflammatory reaction due to other urinary catheter, initial encounter: Secondary | ICD-10-CM | POA: Diagnosis present

## 2018-06-23 DIAGNOSIS — Z950 Presence of cardiac pacemaker: Secondary | ICD-10-CM | POA: Diagnosis not present

## 2018-06-23 DIAGNOSIS — I5042 Chronic combined systolic (congestive) and diastolic (congestive) heart failure: Secondary | ICD-10-CM | POA: Diagnosis present

## 2018-06-23 DIAGNOSIS — Z87440 Personal history of urinary (tract) infections: Secondary | ICD-10-CM | POA: Diagnosis not present

## 2018-06-23 DIAGNOSIS — B964 Proteus (mirabilis) (morganii) as the cause of diseases classified elsewhere: Secondary | ICD-10-CM | POA: Diagnosis present

## 2018-06-23 DIAGNOSIS — I959 Hypotension, unspecified: Secondary | ICD-10-CM | POA: Diagnosis not present

## 2018-06-23 DIAGNOSIS — E860 Dehydration: Secondary | ICD-10-CM | POA: Diagnosis not present

## 2018-06-23 DIAGNOSIS — J309 Allergic rhinitis, unspecified: Secondary | ICD-10-CM | POA: Diagnosis present

## 2018-06-23 DIAGNOSIS — F0390 Unspecified dementia without behavioral disturbance: Secondary | ICD-10-CM | POA: Diagnosis present

## 2018-06-23 DIAGNOSIS — R918 Other nonspecific abnormal finding of lung field: Secondary | ICD-10-CM | POA: Diagnosis not present

## 2018-06-23 DIAGNOSIS — F0391 Unspecified dementia with behavioral disturbance: Secondary | ICD-10-CM | POA: Diagnosis not present

## 2018-06-23 DIAGNOSIS — J189 Pneumonia, unspecified organism: Secondary | ICD-10-CM | POA: Diagnosis not present

## 2018-06-23 DIAGNOSIS — N319 Neuromuscular dysfunction of bladder, unspecified: Secondary | ICD-10-CM | POA: Diagnosis not present

## 2018-06-29 DIAGNOSIS — J189 Pneumonia, unspecified organism: Secondary | ICD-10-CM | POA: Diagnosis not present

## 2018-06-29 DIAGNOSIS — I110 Hypertensive heart disease with heart failure: Secondary | ICD-10-CM | POA: Diagnosis not present

## 2018-06-29 DIAGNOSIS — Z936 Other artificial openings of urinary tract status: Secondary | ICD-10-CM | POA: Diagnosis not present

## 2018-06-29 DIAGNOSIS — I5042 Chronic combined systolic (congestive) and diastolic (congestive) heart failure: Secondary | ICD-10-CM | POA: Diagnosis not present

## 2018-06-29 DIAGNOSIS — E119 Type 2 diabetes mellitus without complications: Secondary | ICD-10-CM | POA: Diagnosis not present

## 2018-06-29 DIAGNOSIS — E440 Moderate protein-calorie malnutrition: Secondary | ICD-10-CM | POA: Diagnosis not present

## 2018-06-29 DIAGNOSIS — I482 Chronic atrial fibrillation, unspecified: Secondary | ICD-10-CM | POA: Diagnosis not present

## 2018-06-29 DIAGNOSIS — M6281 Muscle weakness (generalized): Secondary | ICD-10-CM | POA: Diagnosis not present

## 2018-06-29 DIAGNOSIS — N390 Urinary tract infection, site not specified: Secondary | ICD-10-CM | POA: Diagnosis not present

## 2018-06-29 DIAGNOSIS — B964 Proteus (mirabilis) (morganii) as the cause of diseases classified elsewhere: Secondary | ICD-10-CM | POA: Diagnosis not present

## 2018-06-29 DIAGNOSIS — Z7401 Bed confinement status: Secondary | ICD-10-CM | POA: Diagnosis not present

## 2018-06-29 DIAGNOSIS — R404 Transient alteration of awareness: Secondary | ICD-10-CM | POA: Diagnosis not present

## 2018-06-29 DIAGNOSIS — R29898 Other symptoms and signs involving the musculoskeletal system: Secondary | ICD-10-CM | POA: Diagnosis not present

## 2018-06-29 DIAGNOSIS — Z681 Body mass index (BMI) 19 or less, adult: Secondary | ICD-10-CM | POA: Diagnosis not present

## 2018-06-29 DIAGNOSIS — F0391 Unspecified dementia with behavioral disturbance: Secondary | ICD-10-CM | POA: Diagnosis not present

## 2018-06-29 DIAGNOSIS — R2689 Other abnormalities of gait and mobility: Secondary | ICD-10-CM | POA: Diagnosis not present

## 2018-06-29 DIAGNOSIS — I69351 Hemiplegia and hemiparesis following cerebral infarction affecting right dominant side: Secondary | ICD-10-CM | POA: Diagnosis not present

## 2018-06-29 DIAGNOSIS — T83518A Infection and inflammatory reaction due to other urinary catheter, initial encounter: Secondary | ICD-10-CM | POA: Diagnosis not present

## 2018-06-29 DIAGNOSIS — N319 Neuromuscular dysfunction of bladder, unspecified: Secondary | ICD-10-CM | POA: Diagnosis not present

## 2018-07-06 ENCOUNTER — Other Ambulatory Visit: Payer: Self-pay | Admitting: *Deleted

## 2018-07-06 NOTE — Patient Outreach (Signed)
Telephonic IDT meeting completed. Per Casimer Bilis at facility, patient is from LTC and after skilled care complete will return to LTC at facility. No plans for discharge. No [facility] care management needs assessed. Plan to sign off. Alben Spittle. Albertha Ghee, MSN, RN, [facility], CCM  Post Acute Chartered loss adjuster [phone]) Business Cell  [phone]) Toll Free Office

## 2018-07-22 DEATH — deceased

## 2019-01-19 ENCOUNTER — Other Ambulatory Visit: Payer: Self-pay
